# Patient Record
Sex: Male | Born: 1955
Health system: Southern US, Community
[De-identification: ages and names within clinical notes are randomized; demographics above are authoritative.]

## PROBLEM LIST (undated history)

## (undated) DIAGNOSIS — N529 Male erectile dysfunction, unspecified: Secondary | ICD-10-CM

## (undated) DIAGNOSIS — I219 Acute myocardial infarction, unspecified: Secondary | ICD-10-CM

## (undated) DIAGNOSIS — E782 Mixed hyperlipidemia: Secondary | ICD-10-CM

## (undated) DIAGNOSIS — D699 Hemorrhagic condition, unspecified: Secondary | ICD-10-CM

## (undated) DIAGNOSIS — I10 Essential (primary) hypertension: Secondary | ICD-10-CM

## (undated) DIAGNOSIS — R0600 Dyspnea, unspecified: Secondary | ICD-10-CM

## (undated) DIAGNOSIS — F419 Anxiety disorder, unspecified: Secondary | ICD-10-CM

## (undated) DIAGNOSIS — E7800 Pure hypercholesterolemia, unspecified: Secondary | ICD-10-CM

## (undated) DIAGNOSIS — J449 Chronic obstructive pulmonary disease, unspecified: Secondary | ICD-10-CM

## (undated) DIAGNOSIS — F32A Depression, unspecified: Secondary | ICD-10-CM

## (undated) DIAGNOSIS — F329 Major depressive disorder, single episode, unspecified: Secondary | ICD-10-CM

## (undated) DIAGNOSIS — K219 Gastro-esophageal reflux disease without esophagitis: Secondary | ICD-10-CM

## (undated) DIAGNOSIS — I2510 Atherosclerotic heart disease of native coronary artery without angina pectoris: Secondary | ICD-10-CM

## (undated) HISTORY — DX: Hemorrhagic condition, unspecified: D69.9

## (undated) HISTORY — DX: Pure hypercholesterolemia, unspecified: E78.00

## (undated) HISTORY — PX: ESOPHAGOGASTRODUODENOSCOPY: SHX1529

## (undated) HISTORY — PX: NO PAST SURGERIES: SHX2092

## (undated) HISTORY — DX: Atherosclerotic heart disease of native coronary artery without angina pectoris: I25.10

## (undated) HISTORY — DX: Male erectile dysfunction, unspecified: N52.9

## (undated) HISTORY — DX: Essential (primary) hypertension: I10

## (undated) HISTORY — DX: Mixed hyperlipidemia: E78.2

---
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
## 2002-03-23 DIAGNOSIS — I219 Acute myocardial infarction, unspecified: Secondary | ICD-10-CM

## 2002-03-23 HISTORY — PX: CARDIAC CATHETERIZATION: SHX172

## 2002-03-23 HISTORY — PX: OTHER SURGICAL HISTORY: SHX169

## 2002-03-23 HISTORY — DX: Acute myocardial infarction, unspecified: I21.9

## 2002-07-12 ENCOUNTER — Inpatient Hospital Stay (HOSPITAL_COMMUNITY): Admission: EM | Admit: 2002-07-12 | Discharge: 2002-07-14 | Payer: Self-pay | Admitting: *Deleted

## 2002-07-12 ENCOUNTER — Encounter: Payer: Self-pay | Admitting: Cardiology

## 2004-04-17 ENCOUNTER — Ambulatory Visit: Payer: Self-pay | Admitting: Cardiology

## 2005-05-20 ENCOUNTER — Ambulatory Visit: Payer: Self-pay | Admitting: Cardiology

## 2005-05-25 ENCOUNTER — Ambulatory Visit: Payer: Self-pay | Admitting: Cardiology

## 2006-06-01 ENCOUNTER — Ambulatory Visit: Payer: Self-pay | Admitting: Cardiology

## 2006-06-14 ENCOUNTER — Encounter: Payer: Self-pay | Admitting: Physician Assistant

## 2006-06-15 ENCOUNTER — Ambulatory Visit: Payer: Self-pay | Admitting: Cardiology

## 2007-04-22 ENCOUNTER — Encounter: Payer: Self-pay | Admitting: Cardiology

## 2007-06-06 ENCOUNTER — Encounter: Payer: Self-pay | Admitting: Cardiology

## 2007-08-02 ENCOUNTER — Ambulatory Visit: Payer: Self-pay | Admitting: Cardiology

## 2007-12-22 ENCOUNTER — Encounter: Payer: Self-pay | Admitting: Cardiovascular Disease

## 2007-12-22 ENCOUNTER — Encounter: Payer: Self-pay | Admitting: Cardiology

## 2008-08-29 ENCOUNTER — Ambulatory Visit: Payer: Self-pay | Admitting: Cardiology

## 2008-09-28 ENCOUNTER — Encounter (INDEPENDENT_AMBULATORY_CARE_PROVIDER_SITE_OTHER): Payer: Self-pay | Admitting: *Deleted

## 2008-12-10 DIAGNOSIS — E785 Hyperlipidemia, unspecified: Secondary | ICD-10-CM

## 2008-12-10 DIAGNOSIS — I2510 Atherosclerotic heart disease of native coronary artery without angina pectoris: Secondary | ICD-10-CM | POA: Insufficient documentation

## 2008-12-10 DIAGNOSIS — I10 Essential (primary) hypertension: Secondary | ICD-10-CM | POA: Insufficient documentation

## 2009-07-03 ENCOUNTER — Telehealth (INDEPENDENT_AMBULATORY_CARE_PROVIDER_SITE_OTHER): Payer: Self-pay | Admitting: *Deleted

## 2009-07-18 ENCOUNTER — Ambulatory Visit: Payer: Self-pay | Admitting: Cardiology

## 2009-07-18 ENCOUNTER — Encounter (INDEPENDENT_AMBULATORY_CARE_PROVIDER_SITE_OTHER): Payer: Self-pay | Admitting: *Deleted

## 2009-07-18 DIAGNOSIS — R072 Precordial pain: Secondary | ICD-10-CM

## 2009-07-22 ENCOUNTER — Encounter: Payer: Self-pay | Admitting: Cardiology

## 2009-07-22 ENCOUNTER — Encounter: Payer: Self-pay | Admitting: Cardiovascular Disease

## 2009-07-22 ENCOUNTER — Ambulatory Visit: Payer: Self-pay | Admitting: Cardiology

## 2009-08-07 ENCOUNTER — Encounter: Payer: Self-pay | Admitting: Cardiology

## 2009-08-14 ENCOUNTER — Encounter: Payer: Self-pay | Admitting: Cardiovascular Disease

## 2009-08-14 ENCOUNTER — Ambulatory Visit: Payer: Self-pay | Admitting: Cardiology

## 2009-08-22 ENCOUNTER — Encounter (INDEPENDENT_AMBULATORY_CARE_PROVIDER_SITE_OTHER): Payer: Self-pay | Admitting: *Deleted

## 2009-11-06 ENCOUNTER — Ambulatory Visit: Payer: Self-pay | Admitting: Cardiology

## 2009-11-06 DIAGNOSIS — F528 Other sexual dysfunction not due to a substance or known physiological condition: Secondary | ICD-10-CM | POA: Insufficient documentation

## 2010-04-22 NOTE — Progress Notes (Signed)
Summary: PATIENT CALLED REQUESTING EARLIER APPT FOR CP  Medications Added NITROSTAT 0.4 MG SUBL (NITROGLYCERIN) 1 tab under tongue for chest pain,repeat evry 5 min up to 3 doses if no relief. Proced to ED if chest pain continues       Phone Note Call from Patient Call back at Home Phone [phone]   Caller: Patient Call For: nurse Summary of Call: Patient c/o chest tightness that come and go lasting about 2-3 min. and wanted an earlier appointment than June. Patient has nitro but hasn't used it. Nurse informed patient to go to ED for evaluation. Patient stated that he would go if pain came again. Nurse offered new rx to be sent,uses [facility]   Initial call taken by: Carlye Grippe,  July 03, 2009 11:52 AM  Follow-up for Phone Call        okay to add earlier when opening Follow-up by: Lewayne Bunting, MD, [facility],  July 04, 2009 2:11 PM  Additional Follow-up for Phone Call Additional follow up Details #1::        patient's mother informed that appointment  given for April 28th @1 :00pm with Dr. Andee Lineman.  Additional Follow-up by: Carlye Grippe,  July 04, 2009 2:44 PM    New/Updated Medications: NITROSTAT 0.4 MG SUBL (NITROGLYCERIN) 1 tab under tongue for chest pain,repeat evry 5 min up to 3 doses if no relief. Proced to ED if chest pain continues Prescriptions: NITROSTAT 0.4 MG SUBL (NITROGLYCERIN) 1 tab under tongue for chest pain,repeat evry 5 min up to 3 doses if no relief. Proced to ED if chest pain continues  #25 x 0   Entered by:   Carlye Grippe   Authorized by:   Lewayne Bunting, MD, [facility]   Signed by:   Carlye Grippe on 07/03/2009   Method used:   Electronically to        [facility] (retail)       [address]       [place], [place]  [address]       Ph: [phone]       Fax: [phone]   RxID:   [id number]

## 2010-04-22 NOTE — Letter (Signed)
Summary: Engineer, materials at [facility]  [address]   [place], [place] [address]   Phone: [phone]  Fax: [phone]        August 22, 2009 MRN: [id number]   OJAS COONE [address] [place], [place]  [address]   Dear Mr. Artola,  Your test ordered by Selena Batten has been reviewed by your physician (or physician assistant) and was found to be normal or stable. Your physician (or physician assistant) felt no changes were needed at this time.  ____ Echocardiogram  __X__ Cardiac Stress Test  ____ Lab Work  ____ Peripheral vascular study of arms, legs or neck  ____ CT scan or X-ray  ____ Lung or Breathing test  ____ Other:   Thank you.   Hoover Brunette, LPN    Duane Boston, M.D., F.A.C.C. Thressa Sheller, M.D., F.A.C.C. Oneal Grout, M.D., F.A.C.C. Cheree Ditto, M.D., F.A.C.C. Daiva Nakayama, M.D., F.A.C.C. Kenney Houseman, M.D., F.A.C.C. Jeanne Ivan, PA-C

## 2010-04-22 NOTE — Cardiovascular Report (Signed)
Summary: Cardiac Catheterization  Cardiac Catheterization   Imported By: Dorise Hiss 07/17/2009 15:13:48  _____________________________________________________________________  External Attachment:    Type:   Image     Comment:   External Document

## 2010-04-22 NOTE — Assessment & Plan Note (Signed)
Summary: patient requested early appt for CP/LA   Visit Type:  chest pain/ follow-up Primary Provider:  Sherryll Burger  CC:  follow-up visit and been having chest pain.  History of Present Illness: the patient is a pleasant 55 year old male history of coronary artery disease, status post anterior wall myocardial infarction requiring Cypher stent x2 in 2004 the patient had a Cardiolite study done in 2009 which was within normal limits. He has known residual lesion of 80% in the distal circumflex coronary artery. He has preserved LV function.  The patient presents with symptoms of atypical chest pain. He states when he is watching TV mostly in the evening he still develops a tightness in the middle of the chest but only typically lasts a few seconds. He also states younger days he was at his friend's junkyard and when bending over and tried to lift up something he developed a similar chest pain. On the other hand exertion does not appear to be causing symptoms. The patient walks everyday to his mailbox is approximately 50 yards and reports no chest pain or shortness of breath. He reports no orthopnea PND.  The patient has stopped smoking since 2004. Cardiovascular standpoint he reports no other symptoms. He does state that when he looks up or sometimes has floaters in both eyes.  Preventive Screening-Counseling & Management  Alcohol-Tobacco     Smoking Status: quit     Year Quit: 2004  Current Medications (verified): 1)  Toprol Xl 25 Mg Xr24h-Tab (Metoprolol Succinate) .... Take 1 Tablet Daily 2)  Nitrostat 0.4 Mg Subl (Nitroglycerin) .Marland Kitchen.. 1 Tab Under Tongue For Chest Pain,repeat Evry 5 Min Up To 3 Doses If No Relief. Proced To Ed If Chest Pain Continues 3)  Cialis 10 Mg Tabs (Tadalafil) .... Take 1 Tablet By Mouth As Directed 4)  Metformin Hcl 500 Mg Tabs (Metformin Hcl) .... Take 2 Tablet By Mouth Two Times A Day 5)  Fish Oil 1000 Mg Caps (Omega-3 Fatty Acids) .... Take 4 Tablet By Mouth Once A  Day 6)  Aspir-Low 81 Mg Tbec (Aspirin) .... Take 1 Tablet By Mouth Once A Day 7)  Crestor 10 Mg Tabs (Rosuvastatin Calcium) .... Take 1 Tablet By Mouth Once A Day 8)  Omeprazole 20 Mg Cpdr (Omeprazole) .... Take 1 Tablet By Mouth Once A Day  Allergies (verified): No Known Drug Allergies  Comments:  Nurse/Medical Assistant: The patient's medications and allergies were reviewed with the patient and were updated in the Medication and Allergy Lists. Bottles reviewed.  Past History:  Past Medical History: Last updated: 12/10/2008 HYPERTENSION, UNSPECIFIED (ICD-401.9) HYPERLIPIDEMIA-MIXED (ICD-272.4) CAD, NATIVE VESSEL (ICD-414.01) Erectile dysfunction, on Cialis Diabetes mellitus  Family History: Last updated: 12/10/2008 heart disease  Social History: Last updated: 12/10/2008 Single  Tobacco Use - Former.  Alcohol Use - no  Risk Factors: Smoking Status: quit (07/18/2009)  Clinical Review Panels:  Cardiac Imaging Cardiac Cath Findings  CONCLUSIONS:  1. Anterior wall myocardial infarction treated with percutaneous     transluminal coronary angioplasty and stenting in both the proximal and     mid left anterior descending distribution.  2. Residual circumflex disease.  3. Hypokinesis of the anterolateral apical segment with probable high chance     for recovery given the electrocardiographic and ventriculographic     findings.   PLAN:  1. The patient will be treated with aspirin and Plavix.  2. Rehab.  3. Discontinuation of smoking.  4. I will review the circumflex with my colleagues as to whether this  should     be treated medically.                                             Arturo Morton. Riley Kill, M.D. (07/12/2002)    Review of Systems       The patient complains of chest pain.  The patient denies fatigue, malaise, fever, weight gain/loss, vision loss, decreased hearing, hoarseness, palpitations, shortness of breath, prolonged cough, wheezing, sleep apnea, coughing  up blood, abdominal pain, blood in stool, nausea, vomiting, diarrhea, heartburn, incontinence, blood in urine, muscle weakness, joint pain, leg swelling, rash, skin lesions, headache, fainting, dizziness, depression, anxiety, enlarged lymph nodes, easy bruising or bleeding, and environmental allergies.    Vital Signs:  Patient profile:   55 year old male Height:      70 inches Weight:      197 pounds BMI:     28.37 Pulse rate:   67 / minute BP sitting:   134 / 79  (left arm) Cuff size:   large  Vitals Entered By: Carlye Grippe (July 18, 2009 1:17 PM)  Nutrition Counseling: Patient's BMI is greater than 25 and therefore counseled on weight management options. CC: follow-up visit, been having chest pain   Physical Exam  Additional Exam:  General: Well-developed, well-nourished in no distress head: Normocephalic and atraumatic eyes PERRLA/EOMI intact, conjunctiva and lids normal nose: No deformity or lesions mouth normal dentition, normal posterior pharynx neck: Supple, no JVD.  No masses, thyromegaly or abnormal cervical nodes lungs: Normal breath sounds bilaterally without wheezing.  Normal percussion heart: regular rate and rhythm with normal S1 and S2, no S3 or S4.  PMI is normal.  No pathological murmurs abdomen: Normal bowel sounds, abdomen is soft and nontender without masses, organomegaly or hernias noted.  No hepatosplenomegaly musculoskeletal: Back normal, normal gait muscle strength and tone normal pulsus: Pulse is normal in all 4 extremities Extremities: No peripheral pitting edema neurologic: Alert and oriented x 3 skin: Intact without lesions or rashes cervical nodes: No significant adenopathy psychologic: Normal affect    EKG  Procedure date:  07/18/2009  Findings:      normal sinus rhythm. Normal EKG. Heart rate 66 beats per minute  Impression & Recommendations:  Problem # 1:  PRECORDIAL PAIN (ICD-786.51) the patient's chest pain is very atypical for  angina. We will proceed with an exercise treadmill test. The patient has requested that due to his limited financial means. I do think that this is a reasonable test given the low likelihood that this represents angina. We will hold his beta blocker for 24 hours prior to the test.in the meanwhile I increased the patient's metoprolol to 50 mg a day and added Imdur30 mg p.o. q. daily. I made very clear to the patient that he cannot use his Cialis at the same time. The following medications were removed from the medication list:    Accupril 10 Mg Tabs (Quinapril hcl) .Marland Kitchen... Take 1 tablet by mouth once a day His updated medication list for this problem includes:    Metoprolol Succinate 50 Mg Xr24h-tab (Metoprolol succinate) .Marland Kitchen... Take one tablet by mouth daily    Nitrostat 0.4 Mg Subl (Nitroglycerin) .Marland Kitchen... 1 tab under tongue for chest pain,repeat evry 5 min up to 3 doses if no relief. proced to ed if chest pain continues    Aspir-low 81 Mg Tbec (Aspirin) .Marland KitchenMarland KitchenMarland KitchenMarland Kitchen  Take 1 tablet by mouth once a day    Isosorbide Mononitrate Cr 30 Mg Xr24h-tab (Isosorbide mononitrate) .Marland Kitchen... Take one tablet by mouth daily  Orders: GXT (GXT)  Problem # 2:  HYPERTENSION, UNSPECIFIED (ICD-401.9) Assessment: Comment Only  The following medications were removed from the medication list:    Accupril 10 Mg Tabs (Quinapril hcl) .Marland Kitchen... Take 1 tablet by mouth once a day His updated medication list for this problem includes:    Metoprolol Succinate 50 Mg Xr24h-tab (Metoprolol succinate) .Marland Kitchen... Take one tablet by mouth daily    Aspir-low 81 Mg Tbec (Aspirin) .Marland Kitchen... Take 1 tablet by mouth once a day  Problem # 3:  HYPERLIPIDEMIA-MIXED (ICD-272.4) patient will continue on statin drug therapy The following medications were removed from the medication list:    Simvastatin 80 Mg Tabs (Simvastatin) .Marland Kitchen... Take 1 tablet by mouth once a day His updated medication list for this problem includes:    Crestor 10 Mg Tabs (Rosuvastatin calcium)  .Marland Kitchen... Take 1 tablet by mouth once a day  Other Orders: EKG w/ Interpretation (93000)  Patient Instructions: 1)  Your physician wants you to follow-up in: 3 months. You will receive a reminder letter in the mail one-two months in advance. If you don't receive a letter, please call our office to schedule the follow-up appointment. 2)  Your physician has requested that you have an exercise tolerance test.  For further information please visit [email or web address]  Please also follow instruction sheet, as given. 3)  Increase Toprol (Metoprolol) to 50mg  by mouth once daily. You may take 2 of your 25mg  tablets until gone and then get new prescription filled for 50mg  tablets. 4)  HOLD CIALIS. 5)  Start Imdur (isosorbide) 30mg  by mouth once daily. Prescriptions: ISOSORBIDE MONONITRATE CR 30 MG XR24H-TAB (ISOSORBIDE MONONITRATE) Take one tablet by mouth daily  #30 x 6   Entered by:   Cyril Loosen, RN, BSN   Authorized by:   Lewayne Bunting, MD, [facility]   Signed by:   Cyril Loosen, RN, BSN on 07/18/2009   Method used:   Electronically to        [facility] (retail)       [address]       [place], [place]  [address]       Ph: [phone]       Fax: [phone]   RxID:   [id number]   Handout requested. METOPROLOL SUCCINATE 50 MG XR24H-TAB (METOPROLOL SUCCINATE) Take one tablet by mouth daily  #30 x 6   Entered by:   Cyril Loosen, RN, BSN   Authorized by:   Lewayne Bunting, MD, [facility]   Signed by:   Cyril Loosen, RN, BSN on 07/18/2009   Method used:   Electronically to        [facility] (retail)       [address]       [place], [place]  [address]       Ph: [phone]       Fax: [phone]   RxID:   [id number]

## 2010-04-22 NOTE — Miscellaneous (Signed)
Summary: Orders Update - Lexiscan   Clinical Lists Changes  Orders: Added new Referral order of Nuclear Med (Nuc Med) - Signed 

## 2010-04-22 NOTE — Letter (Signed)
Summary: Graded Exercise Tolerance Test  [facility] [address]   [place], [place] [address]   Phone: [phone]  Fax: [phone]      [facility]  Graded Exercise Tolerance Test    Ronnie Maynard  Appointment Date:_  Appointment Time:_   Your doctor has ordered a stress test to help determine the condition of your  heart during exercise. If you take blood pressure medicine , ask your doctor if you should take it the day of your test. You may eat a light meal before your test.  Please be sure to bring the copy of your order with you.   You should dress comfortably, for example: Sweat pants, shorts, or skirt, Loose                                                                                                       short sleeved T-shirt, Rubber soled lace-up shoes (tennis shoes)  You will need to arrive 15 minutes before your appointment time. You will also need to enter at the Main Entrance of the hospital and go to the registration desk. They will direct you to the Cardiovascular Department on the third floor.  You will need to plan on being at the hospital for one hour from registration for this appointment.   DO NOT TAKE YOUR METOPROLOL (TOPROL) THE NIGHT BEFORE OR MORNING OF YOUR TEST. IT NEEDS TO BE HELD FOR 24 HOURS BEFORE TEST. YOU MAY TAKE OTHER MEDS WITH A SIP OF WATER.

## 2010-04-22 NOTE — Assessment & Plan Note (Signed)
Summary: 3 month fu recv reminder vs   Visit Type:  Follow-up Primary Ronnie Maynard:  Ronnie Maynard   History of Present Illness: the patient is a 55 year old male with a history coronary artery disease, status post prior anterior wall myocardial infarction requiring Cypher stent x 2 in 2004.  A recent Cardiolite study done in June of this year showed no ischemia and preserved LV function.  The patient has no residual lesion of the distal circumflex.  The patient has been doing well.  He reports no recurrent chest pain.  He denies any shortness of breath orthopnea PND.  He denies any palpitations or syncope.  He is requesting a prescription for Levitra today.  Slightly hypertensive in the office which is a one-time reading and reportedly prior his blood pressure has been normal  Preventive Screening-Counseling & Management  Alcohol-Tobacco     Smoking Status: quit     Year Quit: 2044  Current Medications (verified): 1)  Metoprolol Succinate 50 Mg Xr24h-Tab (Metoprolol Succinate) .... Take One Tablet By Mouth Daily 2)  Nitrostat 0.4 Mg Subl (Nitroglycerin) .Marland Kitchen.. 1 Tab Under Tongue For Chest Pain,repeat Evry 5 Min Up To 3 Doses If No Relief. Proced To Ed If Chest Pain Continues 3)  Metformin Hcl 500 Mg Tabs (Metformin Hcl) .... Take 2 Tablet By Mouth Two Times A Day 4)  Fish Oil 1000 Mg Caps (Omega-3 Fatty Acids) .... Take 4 Tablet By Mouth Once A Day 5)  Aspir-Low 81 Mg Tbec (Aspirin) .... Take 1 Tablet By Mouth Once A Day 6)  Crestor 10 Mg Tabs (Rosuvastatin Calcium) .... Take 1 Tablet By Mouth Once A Day 7)  Omeprazole 20 Mg Cpdr (Omeprazole) .... Take 1 Tablet By Mouth Once A Day 8)  Cialis 10 Mg Tabs (Tadalafil) .... Take One Tab 30 Minutes Prior To Sexual Acttivity  Allergies (verified): No Known Drug Allergies  Comments:  Nurse/Medical Assistant: The patient's medications and allergies were verbally reviewed with the patient and were updated in the Medication and Allergy Lists.  Past  History:  Past Medical History: HYPERTENSION, UNSPECIFIED (ICD-401.9) HYPERLIPIDEMIA-MIXED (ICD-272.4) CAD, NATIVE VESSEL (ICD-414.01) Erectile dysfunction, on Levitra Diabetes mellitus Ronnie Maynard by stress study in June of 2011 her LV function ejection fraction 55% normal ventricle volumes normal perfusion.  Review of Systems  The patient denies fatigue, malaise, fever, weight gain/loss, vision loss, decreased hearing, hoarseness, chest pain, palpitations, shortness of breath, prolonged cough, wheezing, sleep apnea, coughing up blood, abdominal pain, blood in stool, nausea, vomiting, diarrhea, heartburn, incontinence, blood in urine, muscle weakness, joint pain, leg swelling, rash, skin lesions, headache, fainting, dizziness, depression, anxiety, enlarged lymph nodes, easy bruising or bleeding, and environmental allergies.    Vital Signs:  Patient profile:   55 year old male Height:      70 inches Weight:      196 pounds Pulse rate:   69 / minute BP sitting:   131 / 87  (left arm) Cuff size:   regular  Vitals Entered By: Carlye Grippe (November 06, 2009 1:44 PM)  Physical Exam  Additional Exam:  General: Well-developed, well-nourished in no distress head: Normocephalic and atraumatic eyes PERRLA/EOMI intact, conjunctiva and lids normal nose: No deformity or lesions mouth normal dentition, normal posterior pharynx neck: Supple, no JVD.  No masses, thyromegaly or abnormal cervical nodes lungs: Normal breath sounds bilaterally without wheezing.  Normal percussion heart: regular rate and rhythm with normal S1 and S2, no S3 or S4.  PMI is normal.  No pathological murmurs abdomen:  Normal bowel sounds, abdomen is soft and nontender without masses, organomegaly or hernias noted.  No hepatosplenomegaly musculoskeletal: Back normal, normal gait muscle strength and tone normal pulsus: Pulse is normal in all 4 extremities Extremities: No peripheral pitting edema neurologic: Alert and oriented x  3 skin: Intact without lesions or rashes cervical nodes: No significant adenopathy psychologic: Normal affect    Impression & Recommendations:  Problem # 1:  CAD, NATIVE VESSEL (ICD-414.01) patient had a recent negative Cardui stress study.  He is asymptomatic.  Continue with medical therapy The following medications were removed from the medication list:    Isosorbide Mononitrate Cr 30 Mg Xr24h-tab (Isosorbide mononitrate) .Marland Kitchen... Take one tablet by mouth daily His updated medication list for this problem includes:    Metoprolol Succinate 50 Mg Xr24h-tab (Metoprolol succinate) .Marland Kitchen... Take one tablet by mouth daily    Nitrostat 0.4 Mg Subl (Nitroglycerin) .Marland Kitchen... 1 tab under tongue for chest pain,repeat evry 5 min up to 3 doses if no relief. proced to ed if chest pain continues    Aspir-low 81 Mg Tbec (Aspirin) .Marland Kitchen... Take 1 tablet by mouth once a day  Problem # 2:  HYPERLIPIDEMIA-MIXED (ICD-272.4) the patient is taking a statin and this is followed by the primary care physician. His updated medication list for this problem includes:    Crestor 10 Mg Tabs (Rosuvastatin calcium) .Marland Kitchen... Take 1 tablet by mouth once a day  Problem # 3:  ERECTILE DYSFUNCTION, NON-ORGANIC (ICD-302.72) I've given the patient prescription of Levitra.  His been advised not to use nitroglycerin around the time he takes Levitra.  His been carefully advised about this  Patient Instructions: 1)  Cialis 10mg  x 1 tab 30 minutes prior to sexual activity 2)  Follow up in  1 year Prescriptions: CIALIS 10 MG TABS (TADALAFIL) take one tab 30 minutes prior to sexual acttivity  #30 x 1   Entered by:   Hoover Brunette, LPN   Authorized by:   Lewayne Bunting, MD, [facility]   Signed by:   Hoover Brunette, LPN on 88/41/6606   Method used:   Electronically to        [facility] (retail)       [address]       [place], [place]  [address]       Ph: [phone]       Fax: [phone]   RxID:   [id number]

## 2010-08-05 NOTE — Assessment & Plan Note (Signed)
[facility]                          [facility]   NAME:Ronnie Maynard, Ronnie Maynard                         MRN:          [id number]  DATE:08/02/2007                            DOB:          01-28-1956    PRIMARY CARDIOLOGIST:  Jonelle Sidle, MD.   REASON FOR VISIT:  Annual followup.   Since last seen here in the clinic in March 2008, [name] reports no  interim development of signs/symptoms suggestive of unstable angina  pectoris.  He has not had to use any nitroglycerin.  He has not resumed  smoking tobacco, since his heart attack in 2004.   The patient underwent recent esophageal dilatation, by Dr. Karilyn Cota,  approximately 1 month ago.  He states that he has had this in the past.  He apparently was having significant dysphagia over the past several  months, and this has since resolved.   The patient was also briefly hospitalized here at [facility] in early January for chest pain, deemed atypical.  He is now  referred to [place] for formal consultation.  Review of hospital records  reveals completely normal cardiac markers.  Most recent lipid profile  from 1 month ago yielded total cholesterol 150, triglyceride 237, HDL 37  and LDL 66.  The patient was advised to start fish oil, which he has  done.   The patient is still plagued by erectile dysfunction, which has been  documented in our office chart in the past.  He had been prescribed  Levitra by Roxanne Mins, PA-C, back in 2007.  However, he informs me  today that he lost this prescription.  He states that he has never used  any of these agents.  Of note, he has not taken any long-acting nitrate,  which had been stopped in the past.  He also has not had to use any  sublingual nitroglycerin since his heart attack.   I did refer [name] for a surveillance exercise stress Cardiolite,  when I last saw him a year ago.  He was able to exercise for 9 minutes,  achieving  10.1 METS.  There was no associated chest pain or significant  EKG changes.  Perfusion imaging revealed a small, nonreversible inferior  defect suggestive of prior infarct, with no definite reversibility.   CURRENT MEDICATIONS:  1. Zocor 80 daily.  2. Metoprolol ER 50 daily.  3. Accupril 10 daily.  4. Full dose aspirin.  5. Metformin 500 daily.  6. Fish oil 1000 daily.   PHYSICAL EXAMINATION:  Blood pressure 120/70, pulse 65 and regular,  weight 207.8 (up 1).  GENERAL:  A 55 year old male, sitting upright, in no distress.  HEENT:  Normocephalic, atraumatic.  NECK:  Palpable bilateral carotid pulses without bruits; no JVD at 90  degrees.  LUNGS:  Clear to auscultation in all fields.  HEART:  Regular rate and rhythm (S1-S2) no murmurs, rubs or gallops.  ABDOMEN:  Protuberant, nontender.  EXTREMITIES:  No pedal edema.  NEUROLOGIC:  No focal deficit.   IMPRESSION:  1. Coronary artery disease.  a.     Status post acute anterior MI/emergent Cypher stenting LAD       (2), in April 2004.      b.     Residual 80% distal CFX, treated medically.      c.     Preserved left ventricular function.      d.     Low-risk adequate exercise stress Cardiolite; ejection       fraction 59%, March 2008.  2. Dyslipidemia.  3. History of tobacco.  4. Erectile dysfunction.  5. Hypertension.   PLAN:  1. Decrease aspirin back to 81 daily, which I had previously      recommended.  2. Followup FLP in 1 month, for reassessment of lipid status in light      of recent recommendation to add fish oil.  3. Down titrate Toprol back to 25 daily, which the patient had been on      when I last saw him a year ago.  This is particularly in light of      the fact that he has persistent erectile dysfunction.  I do      recommend, however, that the patient remain on a low dose of beta-      blocker, given his history of myocardial infarction.  The decreased      dose may mitigate his erectile dysfunction and  symptoms.  It these      persist, however, I did suggest that he consider seeing a      urologist.  In the meanwhile, he did request, and I did concede to,      using Cialis on a short-term basis.  We spoke at length about the      precautions with respect to using nitrates within a 24-hour time      frame.  He states that he is quite aware of this and had been      advised about this in the past, as well.  4. The patient was advised to modify his diet with respect to      saturated fat and carbohydrate intake.  He is also to try to walk      on a regular basis.  5. Recommend return clinic followup with Dr. Nona Dell in 1      year, or sooner as needed.     Gene Serpe, PA-C  Electronically Signed      Learta Codding, MD,FACC  Electronically Signed   GS/MedQ  DD: 08/02/2007  DT: 08/02/2007  Job #: [id number]   cc:   Kirstie Peri, MD

## 2010-08-05 NOTE — Assessment & Plan Note (Signed)
[facility]                          [facility] NOTE   NAME:Ronnie Maynard, Ronnie Maynard                         MRN:          [id number]  DATE:08/29/2008                            DOB:          Jan 09, 1956    HISTORY OF PRESENT ILLNESS:  The patient is a very pleasant 55 year old  male with a history of coronary artery disease, status post anterior  wall myocardial infarction requiring Cypher stent x2 in 2004.  Last  year, he had Lexiscan done in Dr. Margaretmary Eddy office and the study was normal  with an ejection fraction of 51% with no ischemia.  The patient has been  doing well.  He has good exercise tolerance.  He denies any chest pain,  short of breath, orthopnea, or PND.  He asked if it was safe to use  Cialis and I told him as far as he does not use the nitrates that this  would be okay.   MEDICATIONS:  1. Zocor 80 mg p.o. daily.  2. Accupril 10 mg p.o. daily.  3. Metformin 500 mg p.o. daily.  4. Fish oil 1000 mg p.o. daily.  5. Aspirin 81 mg p.o. daily.  6. Toprol 25 mg p.o. daily.  7. Zocor 80 mg p.o. daily.   PHYSICAL EXAMINATION:  VITAL SIGNS:  Blood pressure is 124/70, heart  rate is 65, and weight is 196.6.  GENERAL:  Well-nourished white male in no apparent distress.  HEENT:  Pupils, eyes are equal.  Conjunctivae clear.  NECK:  Supple.  Normal carotid upstroke.  No carotid bruits.  LUNGS:  Clear breath sounds bilaterally.  HEART:  Regular rate and rhythm with normal S1 and S2.  No murmur, rubs,  or gallops.  ABDOMEN:  Soft, nontender.  No rebound or guarding.  Good bowel sounds.  EXTREMITIES:  No cyanosis, clubbing, or edema.  NEUROLOGIC:  The patient is alert and oriented.  Grossly nonfocal.   PROBLEM LIST:  1. Coronary artery disease.      a.     Status post acute anterior myocardial infarction, emergent       Cypher stenting left anterior descending artery x2 in April 2004.      b.     Residual 80% distal circumflex, treated  medically.      c.     Preserved left ventricular function.      d.     Negative Lexiscan study by Dr. Sherryll Burger in 2009.  2. Dyslipidemia.  3. Tobacco use.  4. Erectile dysfunction, on Cialis.  5. Hypertension.  6. Diabetes mellitus.   PLAN:  1. The patient is doing extremely well.  He has no recurrent      substernal chest pain.  No further stress testing is needed at this      point in time as it was done by Dr. Sherryll Burger last year.  2. Lipid panel is also followed by Dr. Sherryll Burger and believe change in      medications up to him.  3. I did see in the prior records, the patient has had high  triglycerides and I told him to cut back on a carbohydrate      particularly in light of his diabetes.     Learta Codding, MD,FACC  Electronically Signed    GED/MedQ  DD: 08/29/2008  DT: 08/30/2008  Job #: [id number]   cc:   Kirstie Peri, MD

## 2010-08-08 NOTE — Discharge Summary (Signed)
NAME:  Ronnie Maynard, Ronnie Maynard NO.:  [id number]   MEDICAL RECORD NO.:  [id number]                   PATIENT TYPE:  INP   LOCATION:  2926                                 FACILITY:  [facility]   PHYSICIAN:  Jonelle Sidle, M.D. [facility]        DATE OF BIRTH:  April 26, 1955   DATE OF ADMISSION:  07/12/2002  DATE OF DISCHARGE:  07/14/2002                           DISCHARGE SUMMARY - REFERRING   PROCEDURES:  Emergent coronary angiogram/stenting on July 12, 2002.   REASON FOR ADMISSION:  Please refer to the dictated admission note.   LABORATORY DATA:  WBC 10.2, HGB 18, HCT 51.5, platelets 281 on admission.  Electrolytes and renal function normal.  Liver enzymes normal.  Normal  homocystine at 12.49.  Cardiac enzymes:  Normal total CPKs/peak MB 9.0; peak  troponin I 1.24.  Lipid profile:  Total cholesterol 196, triglycerides 208,  HDL 30, LDL 124 (ratio 6.5).  TSH normal.   Admission CXR:  Minimal thoracolumbar scoliosis, otherwise normal.   HOSPITAL COURSE:  Following presentation with new onset left-sided chest  discomfort with no known previous history of coronary artery disease, the  patient was found to have evidence of acute ST elevation anterior myocardial  infarction.  He was taken directly to the catheterization lab where he  underwent coronary angiography by Dr. Riley Kill (see report for full details).   The patient was found to have 95% proximal stenosis of the LAD which was  successfully stented (CYPHER) to 0% residual stenosis.  Additionally, there  was an 80% mid LAD lesion which was also stented.  Residual anatomy notable  for 80% mid circumflex and mild critical RCA.  LV function was preserved.   His postoperative was benign with no complaint of recurrent chest pain.   The patient was referred for smoking cessation consult and expressed desire  to discontinue on his own.   Low normal potassium was repeated prior to discharge.   The patient was  ambulating without complaint of chest pain and was cleared  for discharge on hospital day #2.   MEDICATIONS AT DISCHARGE:  1. Plavix 75 mg daily (x 6 months).  2. Coated aspirin 325 mg daily.  3. Lopressor 25 mg b.i.d.  4. Zocor 20 mg q.h.s.  5. Altace 2.5 mg daily.  6. Nitrostat 0.4 mg p.r.n.   ACTIVITY:  No heaving lifting, driving, or strenuous activity until seen by  physician.   DIET:  Maintain low-fat/cholesterol diet.   SPECIAL INSTRUCTIONS:  Stop smoking tobacco.   WOUND CARE:  Call the office if there is any swelling/bleeding in the groin.   FOLLOWUP:  The patient is scheduled to follow up with Jonelle Sidle,  M.D., in the [facility] on Friday, Jul 28, 2002, at 9:45 a.m.   DISCHARGE DIAGNOSES:  1. Acute anterior ST elevation myocardial infarction.     a. Emergent coronary artery stenting (CYPHER) of left anterior descending  x 3.     b. Residual 80% circumflex.     c. Preserved left ventricular function.  2. Tobacco.  3. Hypokalemia.     Gene Serpe, P.A. [facility]                      Jonelle Sidle, M.D. [facility]    GS/MEDQ  D:  09/27/2002  T:  09/27/2002  Job:  [id number]

## 2010-08-08 NOTE — H&P (Signed)
NAME:  Ronnie Maynard, Ronnie Maynard NO.:  [id number]   MEDICAL RECORD NO.:  [id number]                   PATIENT TYPE:  INP   LOCATION:  1823                                 FACILITY:  [facility]   PHYSICIAN:  Jonelle Sidle, M.D. [facility]        DATE OF BIRTH:  [birth year]   DATE OF ADMISSION:  07/12/2002  DATE OF DISCHARGE:                                HISTORY & PHYSICAL   The patient is followed by Charmayne Sheer, P.A. with [facility] in [place], [place].   CHIEF COMPLAINT:  Chest pain.   HISTORY OF PRESENT ILLNESS:  The patient is a 55 year old male with no major  past medical history by his report, who developed the onset of a cramping  chest discomfort at around 6:30 p.m.  yesterday while driving.  This  discomfort waxed and waned throughout the entire evening, and ultimately  this morning when he presented to work, he was encouraged to seek medical  attention.  He works in [employer] in [employer] and presented to the  [employer] [place] [facility] Emergency Department, at which time a 12-  lead electrocardiogram showed evidence of an anterior wall myocardial  infarction, with ST elevation in leads V1 through V4, as well as in the high  lateral leads I and aVL.  There were also some mild reciprocal changes in  leads II, III and aVF.  The patient continues to complain to 1-2/10 chest  discomfort.  He denies any prior exertional symptoms or a known history of  coronary artery disease/myocardial infarction.  We discussed an emergent  cardiac catheterization for a clear diagnosis and therapy, including its  risks and benefits.  He agrees to proceed.   ALLERGIES:  No known drug allergies.   CURRENT MEDICATIONS:  None.   PAST MEDICAL HISTORY:  1. No known history of type 2 diabetes mellitus or hypertension.  2. Possible history of dyslipidemia, on no specific medical therapy at this     time.  3. No known history of coronary artery  disease or myocardial infarction.   SOCIAL HISTORY:  The patient works in [employer] in [place].  He has a  40-year-history of tobacco use.  He denies alcohol or other substance abuse.   FAMILY HISTORY:  Significant for cardiovascular disease.  The patient's  father died at age 70, with a myocardial infarction and a stroke.  He also  has uncles with coronary artery disease, predominantly in their 82s to 34s.   REVIEW OF SYSTEMS:  As described in the history of present illness.  He has  had some diaphoresis.  He denied any significant nausea or dyspnea with his  presentation.  His chest pain did not radiate.   PHYSICAL EXAMINATION:  VITAL SIGNS:  The initial heart rate was in the 90s  and regular, showing a normal sinus rhythm, blood pressure initially  160/110.  GENERAL:  This is a  well-nourished male, lying supine, in no acute distress.  HEENT:  Conjunctivae and lids normal.  Pharynx clear.  NECK:  Supple without elevated jugular venous pressure or carotid bruits.  No thyromegaly is noted.  LUNGS:  Clear to auscultation without rales or rhonchi.  Respiratory effort  is not labored.  HEART:  Reveals a regular rate and rhythm without an S3 gallop or a  pericardial rub.  There is no significant murmur at this time.  ABDOMEN:  Soft, without hepatomegaly or bruits.  EXTREMITIES:  No edema.  Peripheral pulses are 1-2+.  SKIN:  No ulcerative changes are noted.  MUSCULOSKELETAL:  No kyphosis noted.  NEUROPSYCHIATRIC:  The patient is alert and oriented x3.  A chest x-ray is currently pending.  A 12-lead electrocardiogram is as outlined in the history of present  illness.  Initial i-STAT results show a creatinine of 0.8, BUN 12, potassium 4.1.  Hemoglobin 19.  The remainder of the laboratory data are pending.   IMPRESSION:  1. Evidence of an acute anterior wall myocardial infarction, based on the     electrocardiogram and symptoms.  The onset of pain was greater than 12     hours  ago.  2. Unknown lipid status, potential history of dyslipidemia.  3. Ongoing tobacco abuse.   PLAN:  1. Will transport the patient emergently to the cardiac catheterization     laboratory for a definitive angiography.  2. Check a fasting lipid profile.  3. Will begin therapy with aspirin and beta blocker.  The patient did     receive intravenous Lopressor in the emergency department initially.  He     is also on nitroglycerin.  4. Will need smoking cessation counseling.  5. Follow the blood pressure closely, as the patient was significantly     hypertension on presentation.  May need directive therapy.                                                 Jonelle Sidle, M.D. [facility]    SGM/MEDQ  D:  07/12/2002  T:  07/12/2002  Job:  [id number]

## 2010-08-08 NOTE — Discharge Summary (Signed)
NAME:  Ronnie Maynard, Ronnie Maynard                            ACCOUNT NO.:  [id number]   MEDICAL RECORD NO.:  [id number]                   PATIENT TYPE:  INP   LOCATION:  2926                                 FACILITY:  [facility]   PHYSICIAN:  Ronnie Maynard, M.D. [facility]        DATE OF BIRTH:  15-Aug-1955   DATE OF ADMISSION:  07/12/2002  DATE OF DISCHARGE:  07/14/2002                           DISCHARGE SUMMARY - REFERRING   PROCEDURES:  Emergent coronary angiogram, April 21.   REASON FOR ADMISSION:  The patient is a 55 year old male, with no prior  history of heart disease, who presented to the [facility] Emergency Room  with acute anterior myocardial infarction.  He presented to Dr. Simona Huh  who, following stabilization of the patient, proceeded with direct transfer  to the cardiac catheterization lab for emergent intervention.  Please refer  to dictated admission note for full details.   LABORATORY DATA:  Cardiac enzymes:  Normal total CPK with peak MB 9 (6-8)  and peak troponin I 1.24.  Lipid profile: Total cholesterol 196,  triglycerides 208, HDL 30, LDL 124 (ratio 6.5).  TSH normal.  Liver enzymes  normal.  Sodium 139, potassium 3.5, glucose 101, BUN 9, creatinine 1.9 at  discharge.  Normal CBC.   HOSPITAL COURSE:  Following presentation to the emergency room with acute  anterior myocardial infarction, the patient was taken directly to the  catheterization lab where he underwent emergent coronary angiography by Dr.  Shawnie Pons (See report for full details.), revealing 95% proximal LAD as  well as 80% mid LAD lesion.  These were both successfully dilated with a  Cypher stent, both to 0% residual stenosis with no noted complications.  Residual anatomy notable for an 80% mid circumflex and 30 to 40% proximal  RCA.  Left ventriculogram revealed anterolateral apical hypokinesis to  akinesis with EF approximately 56%.   Postoperative course was benign.  The patient ambulated in the CCU  with no  recurrent chest discomfort.   Dyslipidemia noted and patient placed on low-dose Zocor.   The patient had prior history of tobacco smoking and appeared very committed  to discontinuing smoking.   At time of discharge, mild hypokalemia was noted and repleted.  A chest x-  ray was also done on morning of discharge and will be reviewed at time of  office followup.   DISCHARGE MEDICATIONS:  1. Plavix 75 mg daily (6 months).  2. Aspirin 325 mg daily.  3. Toprol XL 50 mg daily.  4. Zocor 20 mg q.h.s.  5. Altace 2.5 mg daily.  6. Nitrostat 0.4 mg p.r.n.   DISCHARGE INSTRUCTIONS:  1. No heavy lifting, driving, or strenuous activity until seen by physician.  2. Maintain low-fat, low-cholesterol diet.  3. Call the office if there is any swelling or bleeding in the groin.  4. The patient is strongly advised to stop smoking tobacco.  5. The patient is scheduled to  follow up with Dr. Simona Huh on Friday,     May 7, at 9:45 a.m. at the [facility] in [place].   DISCHARGE DIAGNOSES:  1. Status post acute anterior ST-elevation myocardial infarction.     a. Emergent stent (Cypher to left anterior descending artery x 2).     b. Residual 80% circumflex.     c. Preserved left ventricular function.  2. Tobacco.  3. Dyslipidemia.  4. Hypokalemia.     Gene Serpe, P.A. [facility]                      Ronnie Maynard, M.D. [facility]    GS/MEDQ  D:  07/14/2002  T:  07/15/2002  Job:  [id number]   cc:   [facility], [place], [facility]  [address] [place], [place] [address]

## 2010-08-08 NOTE — Assessment & Plan Note (Signed)
[facility]                          [facility]   NAME:Ronnie Maynard, Ronnie Maynard                         MRN:          [id number]  DATE:06/01/2006                            DOB:          Jan 16, 1956    PRIMARY CARDIOLOGIST:  Dr. Simona Huh.   REASON FOR VISIT:  Annual followup.   Since last seen here in the clinic in February of 2007 by Roxanne Mins,  PA-C, the patient continues to do well clinically with no interim  development of signs or symptoms suggestive of unstable angina pectoris.  He recalls having had right-sided neck pain associated with his MI in  2004, and has not had any recurrent symptoms since undergoing successful  stenting of the left anterior descending artery.   Of note, the patient then had a followup exercise stress test in June of  that year, approximately 2 months later, for assessment of residual  ischemia in the circumflex territory.  Perfusion imaging, however, was  negative for evidence of ischemia with a calculated ejection fraction of  54%.  The patient also had no chest pain during the study.   The patient quit smoking tobacco following his heart attack.   The patient reports compliance with his medications.  He was taken off  Imdur when last seen in the clinic, given that it was noted at that time  that he expressed an interest in trying Levitra for treatment of  erectile dysfunction.  He tells me today, however, that he has not yet  filled his prescription, citing concerns about the use of it.   The patient also has not had any followup blood work since his last  clinic visit.   ELECTROCARDIOGRAM:  Today reveals NSR at 66 BPM with left axis deviation  and no ischemic changes.   CURRENT MEDICATIONS:  1. Full-dose aspirin.  2. Metoprolol ER 25 daily.  3. Accupril 5 daily.  4. Zocor 80 daily.   PHYSICAL EXAM:  Blood pressure 140/83, pulse 67, regular, weight 206.  GENERAL:  A 55 year old male sitting  upright in no distress.  HEENT:  Normocephalic, atraumatic.  NECK:  Palpable carotid pulses without bruits.  LUNGS:  Clear to auscultation in all fields.  HEART:  Regular rate and rhythm (S1, S2).  No murmurs, rubs, or gallops.  ABDOMEN:  Soft, nontender.  Intact bowel sounds.  EXTREMITIES:  Palpable pulses without edema.  NEURO:  No focal deficits.   IMPRESSION:  1. Coronary artery disease.      a.     Status post acute anterior myocardial infarction/emergent       CYPHER stenting left anterior descending (x2) April of 2004.      b.     Residual 80% distal circumflex artery.      c.     Preserved left ventricular function.      d.     Negative exercise stress Cardiolite in June of 2004.  2. Dyslipidemia.  3. History of tobacco.  4. Erectile dysfunction.   PLAN:  1. Schedule exercise stress Cardiolite given that it is now more than  3 years since his last stress test.  2. Decrease aspirin to 81 mg daily.  3. Schedule followup fasting lipid/liver profile, comprehensive      metabolic profile, and hemoglobin A1c.  4. Increase Accupril to 10 mg daily for better blood pressure control.  5. Schedule return clinic followup with Dr. Simona Huh in 1 year,      pending review of the stress test result.      Gene Serpe, PA-C  Electronically Signed      Learta Codding, MD,FACC  Electronically Signed   GS/MedQ  DD: 06/01/2006  DT: 06/03/2006  Job #: [id number]

## 2010-08-08 NOTE — Cardiovascular Report (Signed)
NAME:  Ronnie Maynard, Ronnie Maynard                            ACCOUNT NO.:  [id number]   MEDICAL RECORD NO.:  [id number]                   PATIENT TYPE:  INP   LOCATION:  2926                                 FACILITY:  [facility]   PHYSICIAN:  Arturo Morton. Riley Kill, M.D.             DATE OF BIRTH:  1955/09/26   DATE OF PROCEDURE:  07/12/2002  DATE OF DISCHARGE:                              CARDIAC CATHETERIZATION   INDICATIONS:  The patient is a pleasant 55 year old gentleman who presents  with an acute anterior wall infarction.  He was seen in the emergency room.  Onset of pain was earlier today.  An electrocardiogram was diagnostic and he  was brought to the laboratory for further evaluation.   PROCEDURE:  1. Left heart catheterization.  2. Selective coronary arteriography.  3. Selective left ventriculography.  4. Percutaneous transluminal coronary angioplasty and stenting of the left     anterior descending artery x2.   DESCRIPTION OF PROCEDURE:  The patient was brought to the catheterization  lab, prepped and draped in the usual fashion.  Through an anterior puncture,  the femoral artery was easily entered.  A 7-French sheath was placed.  Views  of the left and right coronary arteries were obtained in multiple  angiographic projections.  Ventriculography was performed in the RAO  projection.  Heparin was given according to protocol.  We then administered  Integrilin and documented an ACT in excess of 200 seconds.  A JL3.5 guiding  catheter was then placed.  The patient had a high-grade proximal left  anterior descending stenosis with TIMI-3 flow.  The lesion was crossed with  a 0.014 high-torque floppy wire.  The patient also had a middling of  intermediate severity.  Following this, the proximal acute lesion was  primarily stented using a 23 x3.0 Cordis CYPHER stent.  This Cordis CYPHER  stent was then post dilated using a 3.5 Quantum Maverick balloon throughout  the course of the stent.   Following this, I brought Dr. Allyson Sabal into the room,  and we reviewed the distal lesion.  It was his opinion that we should go  ahead and stent that as well as it was 80%.  It appeared to be potentially  flow limiting, although we were not completely sure of this.  The second  lesion was stented with a 2.5 x 23 Quantum Maverick balloon.  The stent was  about sized appropriately to the artery, so then we placed a 2.5 Quantum  Maverick dilatation balloon and post dilated this particular stent.  There  was marked improvement in the appearance of the artery.  The proximal 95%  stenosis was reduced to zero percent.  The mid stenosis of 80% was reduced  to zero percent.  TIMI-3 flow was documented in the distal vessel and runoff  was excellent.   After this, all catheters were removed and the femoral sheath was sewn into  place.  He was taken into the holding in satisfactory clinical condition.   HEMODYNAMIC DATA:  1. Central aorta 141/81, mean 106.  2. Left ventricle 118/11.  3. No aortic left ventricular gradient on pullback across the aortic valve.   LEFT VENTRICULOGRAPHY:  1. Ventriculography was performed in the RAO projection.  2. There was anterolateral apical hypokinesis.  3. Ejection fraction was 56%.    CORONARY ANGIOGRAPHY:  1. The left main coronary artery was free of critical disease.  2. The left anterior descending has a 95% stenosis after the first diagonal     and involving across the first septal perforator.  Following stenting     this was reduced to zero percent with the 3.0 x 23-mm stent, which was     post dilated to 3.5.  The distal stent was placed and reduced the     stenosis in the mid vessel from 80% to zero percent.  This overlapped the     origin of 2 diagonals.  The vessel was post dilated with a 2.5-mm high-     pressure balloon.  3. The circumflex provided a large first marginal which was free of     significant disease.  There was an AV circumflex distally  that had about     an 80% stenosis.  4. The right coronary artery demonstrates a mid 30% to 40% area of     narrowing, but the PDA and posterolateral system are free of critical     disease.   CONCLUSIONS:  1. Anterior wall myocardial infarction treated with percutaneous     transluminal coronary angioplasty and stenting in both the proximal and     mid left anterior descending distribution.  2. Residual circumflex disease.  3. Hypokinesis of the anterolateral apical segment with probable high chance     for recovery given the electrocardiographic and ventriculographic     findings.   PLAN:  1. The patient will be treated with aspirin and Plavix.  2. Rehab.  3. Discontinuation of smoking.  4. I will review the circumflex with my colleagues as to whether this should     be treated medically.                                               Arturo Morton. Riley Kill, M.D.    TDS/MEDQ  D:  07/12/2002  T:  07/13/2002  Job:  [id number]   cc:   CV Laboratory

## 2010-09-17 ENCOUNTER — Other Ambulatory Visit: Payer: Self-pay | Admitting: Cardiology

## 2010-12-05 ENCOUNTER — Encounter: Payer: Self-pay | Admitting: Cardiology

## 2010-12-09 ENCOUNTER — Ambulatory Visit (INDEPENDENT_AMBULATORY_CARE_PROVIDER_SITE_OTHER): Payer: Medicare Other | Admitting: Cardiology

## 2010-12-09 ENCOUNTER — Encounter: Payer: Self-pay | Admitting: Cardiology

## 2010-12-09 VITALS — BP 123/74 | HR 67 | Ht 67.0 in | Wt 188.0 lb

## 2010-12-09 DIAGNOSIS — G4700 Insomnia, unspecified: Secondary | ICD-10-CM

## 2010-12-09 DIAGNOSIS — N529 Male erectile dysfunction, unspecified: Secondary | ICD-10-CM | POA: Insufficient documentation

## 2010-12-09 DIAGNOSIS — E785 Hyperlipidemia, unspecified: Secondary | ICD-10-CM

## 2010-12-09 DIAGNOSIS — I2510 Atherosclerotic heart disease of native coronary artery without angina pectoris: Secondary | ICD-10-CM

## 2010-12-09 DIAGNOSIS — I10 Essential (primary) hypertension: Secondary | ICD-10-CM

## 2010-12-09 DIAGNOSIS — F528 Other sexual dysfunction not due to a substance or known physiological condition: Secondary | ICD-10-CM

## 2010-12-09 MED ORDER — TRAZODONE HCL 50 MG PO TABS: ORAL_TABLET | ORAL | Status: DC

## 2010-12-09 NOTE — Assessment & Plan Note (Signed)
Followup laboratory work the patient's primary care physician

## 2010-12-09 NOTE — Progress Notes (Signed)
HPI The patient is a 55 year old male with a history of prior anterior wall myocardial infarction in 2004. He underwent stent placement x2 to the LAD. He has some mild residual circumflex disease. His ejection fraction however was 56%. He has been doing well. He reports no chest pain orthopnea or PND. He reports no palpitations or syncope. He does report sexual dysfunction despite using Cialis. From a cardiovascular standpoint is stable  No Known Allergies  Current Outpatient Prescriptions on File Prior to Visit  Medication Sig Dispense Refill  . aspirin 81 MG tablet Take 81 mg by mouth daily.        . fish oil-omega-3 fatty acids 1000 MG capsule Take 4 capsules by mouth daily.        . metFORMIN (GLUCOPHAGE) 500 MG tablet Take 1,000 mg by mouth 2 (two) times daily with a meal.        . metoprolol (TOPROL-XL) 50 MG 24 hr tablet TAKE ONE TABLET BY MOUTH EVERY DAY  30 tablet  6  . nitroGLYCERIN (NITROSTAT) 0.4 MG SL tablet Place 0.4 mg under the tongue every 5 (five) minutes as needed. May repeat for up to 3 doses.       Marland Kitchen omeprazole (PRILOSEC) 20 MG capsule Take 20 mg by mouth daily.        . rosuvastatin (CRESTOR) 10 MG tablet Take 10 mg by mouth daily.        . tadalafil (CIALIS) 10 MG tablet Take 10 mg by mouth as needed.          Past Medical History  Diagnosis Date  . Hypertension, essential, benign   . Hyperlipidemia, mixed   . CAD (coronary artery disease), native coronary artery   . ED (erectile dysfunction)     On Levitra  . Diabetes mellitus     No past surgical history on file.  Family History  Problem Relation Age of Onset  . Heart disease Other     History   Social History  . Marital Status: Married    Spouse Name: N/A    Number of Children: N/A  . Years of Education: N/A   Occupational History  . Not on file.   Social History Main Topics  . Smoking status: Former Smoker -- 2.0 packs/day for 15 years    Types: Cigarettes    Quit date: 03/23/2002  .  Smokeless tobacco: Never Used  . Alcohol Use: No  . Drug Use: Not on file  . Sexually Active: Not on file   Other Topics Concern  . Not on file   Social History Narrative   Single   [id number] positives as outlined above. The remainder of the 18  point review of systems is negative  PHYSICAL EXAM BP 123/74  Pulse 67  Ht 5\' 7"  (1.702 m)  Wt 188 lb (85.276 kg)  BMI 29.44 kg/m2  SpO2 98%  General: Well-developed, well-nourished in no distress Head: Normocephalic and atraumatic Eyes:PERRLA/EOMI intact, conjunctiva and lids normal Ears: No deformity or lesions Mouth:normal dentition, normal posterior pharynx Neck: Supple, no JVD.  No masses, thyromegaly or abnormal cervical nodes Lungs: Normal breath sounds bilaterally without wheezing.  Normal percussion Cardiac: regular rate and rhythm with normal S1 and S2, no S3 or S4.  PMI is normal.  No pathological murmurs Abdomen: Normal bowel sounds, abdomen is soft and nontender without masses, organomegaly or hernias noted.  No hepatosplenomegaly MSK: Back normal, normal gait muscle strength and tone normal Vascular: Pulse is normal in all  4 extremities Extremities: No peripheral pitting edema Neurologic: Alert and oriented x 3 Skin: Intact without lesions or rashes Lymphatics: No significant adenopathy Psychologic: Normal affect   ECG: Normal sinus rhythm. Nonspecific ST-T wave changes  ASSESSMENT AND PLAN

## 2010-12-09 NOTE — Assessment & Plan Note (Signed)
Blood pressure well controlled. Continue current medical regimen. 

## 2010-12-09 NOTE — Patient Instructions (Signed)
   Trazodone - as needed for sleep  Your physician wants you to follow up in:  1 year.  You will receive a reminder letter in the mail one-two months in advance.  If you don't receive a letter, please call our office to schedule the follow up appointment

## 2010-12-09 NOTE — Assessment & Plan Note (Signed)
The patient was given a prescription for low-dose trazodone 25 mg by mouth each bedtime. The patient can increase the dose to 50 mg by mouth each bedtime if needed.

## 2010-12-09 NOTE — Assessment & Plan Note (Signed)
The patient will be referred to urology.

## 2010-12-09 NOTE — Assessment & Plan Note (Addendum)
Status post anterior wall myocardial infarction with stent placement to the LAD x2 in 2004. The patient reports no recurrent chest pain. We did obtain a bedside echocardiogram and his ejection fraction is within normal limits. He also stress test less than 2 years ago.

## 2010-12-19 ENCOUNTER — Other Ambulatory Visit: Payer: Self-pay | Admitting: *Deleted

## 2010-12-19 DIAGNOSIS — R37 Sexual dysfunction, unspecified: Secondary | ICD-10-CM

## 2011-04-24 ENCOUNTER — Other Ambulatory Visit: Payer: Self-pay | Admitting: Cardiology

## 2011-05-25 ENCOUNTER — Other Ambulatory Visit: Payer: Self-pay | Admitting: Cardiology

## 2011-12-10 ENCOUNTER — Ambulatory Visit: Payer: Medicare Other | Admitting: Cardiology

## 2011-12-17 ENCOUNTER — Ambulatory Visit: Payer: Medicare Other | Admitting: Cardiology

## 2012-02-04 ENCOUNTER — Ambulatory Visit: Payer: Medicare Other | Admitting: Cardiology

## 2012-03-30 ENCOUNTER — Ambulatory Visit: Payer: Medicare Other | Admitting: Cardiology

## 2012-05-20 ENCOUNTER — Ambulatory Visit: Payer: Medicare Other | Admitting: Cardiology

## 2012-06-24 ENCOUNTER — Ambulatory Visit: Payer: Medicare Other | Admitting: Cardiology

## 2012-11-28 ENCOUNTER — Ambulatory Visit: Payer: Medicare Other | Admitting: Cardiology

## 2013-01-03 ENCOUNTER — Ambulatory Visit: Payer: Medicare Other | Admitting: Cardiology

## 2013-02-14 ENCOUNTER — Ambulatory Visit: Payer: Medicare Other | Admitting: Cardiology

## 2013-03-02 ENCOUNTER — Ambulatory Visit (INDEPENDENT_AMBULATORY_CARE_PROVIDER_SITE_OTHER): Payer: Medicare Other | Admitting: Cardiology

## 2013-03-02 ENCOUNTER — Encounter: Payer: Self-pay | Admitting: Cardiology

## 2013-03-02 VITALS — BP 127/76 | HR 99 | Ht 67.0 in | Wt 187.0 lb

## 2013-03-02 DIAGNOSIS — I2510 Atherosclerotic heart disease of native coronary artery without angina pectoris: Secondary | ICD-10-CM

## 2013-03-02 DIAGNOSIS — I10 Essential (primary) hypertension: Secondary | ICD-10-CM

## 2013-03-02 DIAGNOSIS — E785 Hyperlipidemia, unspecified: Secondary | ICD-10-CM

## 2013-03-02 NOTE — Progress Notes (Signed)
Clinical Summary Ronnie Maynard is a 57 y.o.male former patient of Dr Ronnie Maynard, this is our first visit together. He was seen for the following problems.    1. CAD - prior anterior MI in 2004, stents x 2 to the LAD. LVEF 56%. Dr Ronnie Maynard bedside echo 11/2010 also indicated normal systolic function.  07/2009 Stress MPI no ischemia - reports approx once a week to once a month, feeling of mild sharp pain in mid chest 1/10 lasting just a minute. Can occur at rest or with exertion. Notes decreasing in frequency over the last several weeks. - compliant with meds: ASA, Toprol XL, crestor 20 - no tobacco use  2. HTN - doesn't check at home - compliant with meds  3. Hyperlipidemia - compliant with crestor - no recent panel in our system   Past Medical History  Diagnosis Date  . Hypertension, essential, benign   . Hyperlipidemia, mixed   . CAD (coronary artery disease), native coronary artery   . ED (erectile dysfunction)     On Levitra  . Diabetes mellitus      No Known Allergies   Current Outpatient Prescriptions  Medication Sig Dispense Refill  . amitriptyline (ELAVIL) 150 MG tablet Take 150 mg by mouth at bedtime.       Marland Kitchen aspirin 81 MG tablet Take 81 mg by mouth daily.        . metFORMIN (GLUCOPHAGE) 500 MG tablet Take 1,000 mg by mouth 2 (two) times daily with a meal.        . metoprolol (TOPROL-XL) 50 MG 24 hr tablet TAKE ONE TABLET BY MOUTH EVERY DAY  30 tablet  6  . NEXIUM 40 MG capsule Take 40 mg by mouth daily at 12 noon.       . nitroGLYCERIN (NITROSTAT) 0.4 MG SL tablet Place 0.4 mg under the tongue every 5 (five) minutes as needed. May repeat for up to 3 doses.       . rosuvastatin (CRESTOR) 20 MG tablet Take 20 mg by mouth daily.      . sitaGLIPtin (JANUVIA) 100 MG tablet Take 100 mg by mouth daily.      . diclofenac (VOLTAREN) 75 MG EC tablet       . tadalafil (CIALIS) 10 MG tablet Take 10 mg by mouth as needed.         No current facility-administered medications  for this visit.     No past surgical history on file.   No Known Allergies    Family History  Problem Relation Age of Onset  . Heart disease Other      Social History Ronnie Maynard reports that he quit smoking about 10 years ago. His smoking use included Cigarettes. He has a 30 pack-year smoking history. He has never used smokeless tobacco. Ronnie Maynard reports that he does not drink alcohol.   Review of Systems CONSTITUTIONAL: No weight loss, fever, chills, weakness or fatigue.  HEENT: Eyes: No visual loss, blurred vision, double vision or yellow sclerae.No hearing loss, sneezing, congestion, runny nose or sore throat.  SKIN: No rash or itching.  CARDIOVASCULAR: per HPI RESPIRATORY: No shortness of breath, cough or sputum.  GASTROINTESTINAL: No anorexia, nausea, vomiting or diarrhea. No abdominal pain or blood.  GENITOURINARY: No burning on urination, no polyuria NEUROLOGICAL: No headache, dizziness, syncope, paralysis, ataxia, numbness or tingling in the extremities. No change in bowel or bladder control.  MUSCULOSKELETAL: No muscle, back pain, joint pain or stiffness.  LYMPHATICS: No  enlarged nodes. No history of splenectomy.  PSYCHIATRIC: No history of depression or anxiety.  ENDOCRINOLOGIC: No reports of sweating, cold or heat intolerance. No polyuria or polydipsia.  Marland Kitchen   Physical Examination There were no vitals filed for this visit. Filed Weights   03/02/13 1245  Weight: 187 lb (84.823 kg)    Gen: resting comfortably, no acute distress HEENT: no scleral icterus, pupils equal round and reactive, no palptable cervical adenopathy,  CV: RRR, no m/r/g, no JVD, no carotid bruits Resp: Clear to auscultation bilaterally GI: abdomen is soft, non-tender, non-distended, normal bowel sounds, no hepatosplenomegaly MSK: extremities are warm, no edema.  Skin: warm, no rash Neuro:  no focal deficits Psych: appropriate affect    03/02/13 Clinic EKG: sinus rhythm, LAE, no  ischemic changes Assessment and Plan   1. CAD - atypical mild chest pain, continue to follow clinically - continue secondary prevention and risk factor modification  2. HTN - at goal, continue curren meds - have ordered a CMET, pending renal function and K likely start ACE-I at next visit in setting of DM  3. Hyperlpidemia - repeat lipid panel, continue current statin for now.     F/u 3 months    Ronnie Maynard, M.D., F.A.C.C.

## 2013-03-02 NOTE — Patient Instructions (Signed)
Your physician recommends that you schedule a follow-up appointment in: 3 months with Dr. Wyline Mood. This appointment will be scheduled today before you leave.  Your physician recommends that you return for lab work in: tomorrow for Fasting Lipid and CMET. Nothing to eat or drink after midnight.  You can go to the following locations to get lab work done: [facility] [address] Dr. Lysbeth Galas office in [place] Or [facility].   Your physician recommends that you continue on your current medications as directed. Please refer to the Current Medication list given to you today.

## 2013-03-13 ENCOUNTER — Telehealth: Payer: Self-pay | Admitting: Cardiology

## 2013-03-13 NOTE — Telephone Encounter (Signed)
WOULD LIKE A PRESCRIPTION FOR Ronnie Maynard

## 2013-03-13 NOTE — Telephone Encounter (Signed)
Pt informed that we no longer prescribe this medication. Pt stated that Dr. Earnestine Leys prescribed this medication. I informed pt that I was aware that Dr. Earnestine Leys prescribed this medication but our new doctors are not prescribing medications. Only cardiac medications. PT verbalized understanding.

## 2013-03-14 NOTE — Telephone Encounter (Signed)
Thank you for discussing with patient. I will not be handling his xanax, he will need to get refills from his PCP.  Dina Rich MD

## 2013-03-17 ENCOUNTER — Telehealth: Payer: Self-pay | Admitting: Cardiology

## 2013-03-17 NOTE — Telephone Encounter (Signed)
Informed pt of results. Pt verbalized understanding. 

## 2013-03-17 NOTE — Telephone Encounter (Signed)
Message copied by Burnice Logan on Fri Mar 17, 2013 12:13 PM ------      Message from: Dina Rich F      Created: Fri Mar 17, 2013 11:12 AM       Please let patient know labs are normal            Dina Rich MD ------

## 2013-05-25 ENCOUNTER — Encounter: Payer: Medicare Other | Admitting: Cardiology

## 2013-05-25 NOTE — Progress Notes (Signed)
ERROR

## 2013-07-03 ENCOUNTER — Other Ambulatory Visit: Payer: Self-pay | Admitting: Cardiology

## 2013-07-25 ENCOUNTER — Ambulatory Visit: Payer: Medicare Other | Admitting: Cardiology

## 2014-02-01 ENCOUNTER — Encounter: Payer: Self-pay | Admitting: Cardiology

## 2014-02-01 ENCOUNTER — Encounter: Payer: Medicare Other | Admitting: Cardiology

## 2014-02-01 NOTE — Progress Notes (Signed)
ERROR

## 2014-03-07 ENCOUNTER — Encounter: Payer: Self-pay | Admitting: Cardiology

## 2014-03-07 ENCOUNTER — Ambulatory Visit (INDEPENDENT_AMBULATORY_CARE_PROVIDER_SITE_OTHER): Payer: Medicare Other | Admitting: Cardiology

## 2014-03-07 ENCOUNTER — Encounter: Payer: Self-pay | Admitting: *Deleted

## 2014-03-07 VITALS — BP 125/79 | HR 97 | Ht 67.0 in | Wt 180.0 lb

## 2014-03-07 DIAGNOSIS — Z136 Encounter for screening for cardiovascular disorders: Secondary | ICD-10-CM

## 2014-03-07 DIAGNOSIS — I2510 Atherosclerotic heart disease of native coronary artery without angina pectoris: Secondary | ICD-10-CM

## 2014-03-07 DIAGNOSIS — R079 Chest pain, unspecified: Secondary | ICD-10-CM

## 2014-03-07 MED ORDER — NITROGLYCERIN 0.4 MG SL SUBL: 0.4000 mg | SUBLINGUAL_TABLET | SUBLINGUAL | Status: AC | PRN

## 2014-03-07 NOTE — Progress Notes (Signed)
Clinical Summary Ronnie Maynard is a 58 y.o.male seen today for follow up of the following medical problems.   1. CAD  - prior anterior MI in 2004, stents x 2 to the LAD. LVEF 55%. Dr Andee LinemaneGent bedside echo 11/2010 also indicated normal systolic function.  07/2009 Stress MPI no ischemia  - has had some recent episodes of chest pain - episode last night while vaccuming, left sided chest pain. Sharp pain, 2-3/10. No other associated symptoms. Not positional. Pain resolved after resting 2-3 minutes. [facility]   2. HTN  - doesn't check at home  - compliant with meds   3. Hyperlipidemia  - compliant with crestor  - 02/2013 lipid panel: TC 117 TG 221 HDL 36 LDL 37    Past Medical History  Diagnosis Date  . Hypertension, essential, benign   . Hyperlipidemia, mixed   . CAD (coronary artery disease), native coronary artery   . ED (erectile dysfunction)     On Levitra  . Diabetes mellitus      No Known Allergies   Current Outpatient Prescriptions  Medication Sig Dispense Refill  . amitriptyline (ELAVIL) 150 MG tablet Take 150 mg by mouth at bedtime.     Marland Kitchen. aspirin 81 MG tablet Take 81 mg by mouth daily.      . diclofenac (VOLTAREN) 75 MG EC tablet Take 75 mg by mouth 2 (two) times daily as needed.     . metFORMIN (GLUCOPHAGE) 500 MG tablet Take 1,000 mg by mouth 2 (two) times daily with a meal.      . metoprolol (TOPROL-XL) 50 MG 24 hr tablet TAKE ONE TABLET BY MOUTH EVERY DAY 30 tablet 6  . NEXIUM 40 MG capsule Take 40 mg by mouth daily at 12 noon.     . nitroGLYCERIN (NITROSTAT) 0.4 MG SL tablet Place 0.4 mg under the tongue every 5 (five) minutes as needed. May repeat for up to 3 doses.     . rosuvastatin (CRESTOR) 20 MG tablet Take 20 mg by mouth daily.    . sitaGLIPtin (JANUVIA) 100 MG tablet Take 100 mg by mouth daily.    . tadalafil (CIALIS) 10 MG tablet Take 10 mg by mouth as needed.       No current facility-administered  medications for this visit.     No past surgical history on file.   No Known Allergies    Family History  Problem Relation Age of Onset  . Heart disease Other      Social History Ronnie Maynard reports that he quit smoking about 11 years ago. His smoking use included Cigarettes. He has a 30 pack-year smoking history. He has never used smokeless tobacco. Ronnie Maynard reports that he does not drink alcohol.   Review of Systems CONSTITUTIONAL: No weight loss, fever, chills, weakness or fatigue.  HEENT: Eyes: No visual loss, blurred vision, double vision or yellow sclerae.No hearing loss, sneezing, congestion, runny nose or sore throat.  SKIN: No rash or itching.  CARDIOVASCULAR: per HPI RESPIRATORY: No shortness of breath, cough or sputum.  GASTROINTESTINAL: No anorexia, nausea, vomiting or diarrhea. No abdominal pain or blood.  GENITOURINARY: No burning on urination, no polyuria NEUROLOGICAL: No headache, dizziness, syncope, paralysis, ataxia, numbness or tingling in the extremities. No change in bowel or bladder control.  MUSCULOSKELETAL: No muscle, back pain, joint pain or stiffness.  LYMPHATICS: No enlarged nodes. No history of splenectomy.  PSYCHIATRIC: No history of depression or  anxiety.  ENDOCRINOLOGIC: No reports of sweating, cold or heat intolerance. No polyuria or polydipsia.  Marland Kitchen.   Physical Examination p 97 bp 125/79 Wt 180 lbs BMI 28 Gen: resting comfortably, no acute distress HEENT: no scleral icterus, pupils equal round and reactive, no palptable cervical adenopathy,  CV Resp: Clear to auscultation bilaterally GI: abdomen is soft, non-tender, non-distended, normal bowel sounds, no hepatosplenomegaly MSK: extremities are warm, no edema.  Skin: warm, no rash Neuro:  no focal deficits Psych: appropriate affect   Assessment and Plan  1. CAD  - recent episodes of chest pain, unclear if related to ischemia. Will obtain exercise cardiolite to better evaluate and  risk stratify - continue current meds  2. HTN  - at goal, continue curren meds   3. Hyperlpidemia  - continue current statin, request recent panel from pcp    F/ 3-4 weeks   Antoine PocheJonathan F. Oswin Griffith, M.D.

## 2014-03-07 NOTE — Patient Instructions (Signed)
Your physician has requested that you have en exercise stress myoview. For further information please visit [email or web address]. Please follow instruction sheet, as given. Office will contact with results via phone or letter.    Nitroglycerin refill sent to pharm Continue all other medications.   Follow up in  3-4 weeks

## 2014-03-19 ENCOUNTER — Ambulatory Visit (HOSPITAL_COMMUNITY): Admission: RE | Admit: 2014-03-19 | Payer: Medicare Other | Source: Ambulatory Visit

## 2014-03-19 ENCOUNTER — Encounter (HOSPITAL_COMMUNITY): Payer: Medicare Other

## 2014-03-27 ENCOUNTER — Encounter (HOSPITAL_COMMUNITY)
Admission: RE | Admit: 2014-03-27 | Discharge: 2014-03-27 | Disposition: A | Discharge status: Home / Self Care | Payer: Medicare Other | Source: Ambulatory Visit | Attending: Cardiology | Admitting: Cardiology

## 2014-03-27 ENCOUNTER — Ambulatory Visit (HOSPITAL_COMMUNITY)
Admission: RE | Admit: 2014-03-27 | Discharge: 2014-03-27 | Disposition: A | Discharge status: Home / Self Care | Payer: Medicare Other | Source: Ambulatory Visit | Attending: Cardiology | Admitting: Cardiology

## 2014-03-27 ENCOUNTER — Encounter (HOSPITAL_COMMUNITY): Payer: Self-pay

## 2014-03-27 DIAGNOSIS — R079 Chest pain, unspecified: Secondary | ICD-10-CM | POA: Insufficient documentation

## 2014-03-27 DIAGNOSIS — R0602 Shortness of breath: Secondary | ICD-10-CM | POA: Diagnosis not present

## 2014-03-27 MED ORDER — REGADENOSON 0.4 MG/5ML IV SOLN
0.4000 mg | Freq: Once | INTRAVENOUS | Status: AC
  Administered 2014-03-27: 0.4 mg via INTRAVENOUS

## 2014-03-27 MED ORDER — TECHNETIUM TC 99M SESTAMIBI GENERIC - CARDIOLITE
30.0000 | Freq: Once | INTRAVENOUS | Status: AC | PRN
  Administered 2014-03-27: 30 via INTRAVENOUS

## 2014-03-27 MED ORDER — TECHNETIUM TC 99M SESTAMIBI - CARDIOLITE
10.0000 | Freq: Once | INTRAVENOUS | Status: AC | PRN
  Administered 2014-03-27: 09:00:00 10 via INTRAVENOUS

## 2014-03-27 MED ORDER — SODIUM CHLORIDE 0.9 % IJ SOLN
10.0000 mL | INTRAMUSCULAR | Status: DC | PRN
  Administered 2014-03-27: 10 mL via INTRAVENOUS
  Filled 2014-03-27: qty 10

## 2014-03-27 MED ORDER — REGADENOSON 0.4 MG/5ML IV SOLN
INTRAVENOUS | Status: AC
  Filled 2014-03-27: qty 5

## 2014-03-27 MED ORDER — SODIUM CHLORIDE 0.9 % IJ SOLN
INTRAMUSCULAR | Status: AC
  Filled 2014-03-27: qty 10

## 2014-03-27 NOTE — Progress Notes (Signed)
Stress Lab Nurses Notes - Ronnie Maynard  Lynelle SmokeJim E Florentino 03/27/2014 Reason for doing test: Chest Pain and F/U Type of test: Lexiscan Myoview (initial order stress Cardiolite but pt stated last time they did TM they had to give him Eugenie BirksLexiscan) Nurse performing test: Enid Derryhristy Seraya Jobst RN Nuclear Medicine Tech: Lyndel Pleasureyan Liles Echo Tech: None MD performing test: Caffie DammeBranch/K Lawrence NP Family MD: Sherryll BurgerShah Test explained and consent signed: Yes.   IV started: Saline lock started in radiology Symptoms: "little" sob Treatment/Intervention: None Reason test stopped: protocol completed After recovery IV was: Discontinued via X-ray tech and No redness or edema Patient to return to Nuc. Med at :1030am Patient discharged: To another Medical Facility to [place] Patient's Condition upon discharge was: stable Comments: symptoms resolved during recovery. Peak BP 120/64 HR 92, Recovery BP 125/67, HR 89 [name]

## 2014-04-02 ENCOUNTER — Telehealth: Payer: Self-pay | Admitting: *Deleted

## 2014-04-02 NOTE — Telephone Encounter (Signed)
Pt made aware, confirmed appt for 1/13 with Dr. Wyline MoodBranch, forwarded to Dr. Sherryll BurgerShah

## 2014-04-02 NOTE — Telephone Encounter (Signed)
-----   Message from Antoine PocheJonathan F Branch, MD sent at 03/29/2014 10:03 AM EST ----- Stress test overall looks good, no evidence of any significant blockages. Will discuss further at our follow up in a few weeks  Dominga FerryJ Branch MD

## 2014-04-04 ENCOUNTER — Encounter: Payer: Medicare Other | Admitting: Cardiology

## 2014-04-04 NOTE — Progress Notes (Signed)
ERROR

## 2014-04-05 ENCOUNTER — Encounter: Payer: Self-pay | Admitting: Cardiology

## 2014-04-24 ENCOUNTER — Ambulatory Visit: Payer: Medicare Other | Admitting: Cardiology

## 2014-04-24 ENCOUNTER — Encounter: Payer: Self-pay | Admitting: Cardiology

## 2014-04-24 ENCOUNTER — Ambulatory Visit (INDEPENDENT_AMBULATORY_CARE_PROVIDER_SITE_OTHER): Payer: Medicare Other | Admitting: Cardiology

## 2014-04-24 VITALS — BP 130/81 | HR 93 | Ht 67.0 in | Wt 181.0 lb

## 2014-04-24 DIAGNOSIS — R079 Chest pain, unspecified: Secondary | ICD-10-CM

## 2014-04-24 DIAGNOSIS — I2510 Atherosclerotic heart disease of native coronary artery without angina pectoris: Secondary | ICD-10-CM

## 2014-04-24 MED ORDER — ISOSORBIDE MONONITRATE ER 30 MG PO TB24: 30.0000 mg | ORAL_TABLET | Freq: Every day | ORAL | Status: DC

## 2014-04-24 NOTE — Patient Instructions (Signed)
Your physician recommends that you schedule a follow-up appointment in: 2 MONTHS WITH DR. BRANCH  Your physician has recommended you make the following change in your medication:   START IMDUR 30 MG DAILY  CONTINUE ALL OTHER MEDICATIONS AS DIRECTED   Thank you for choosing [facility]!!

## 2014-04-24 NOTE — Progress Notes (Signed)
Clinical Summary Mr. Heppler is a 59 y.o.male seen today for follow up of the following medical problems. This is a focused visit on his history of CAD and chest pain.   1. CAD  - prior anterior MI in 2004, stents x 2 to the LAD. LVEF 55%. Dr Andee Lineman bedside echo 11/2010 also indicated normal systolic function.  - Last visit described some episodes of chest pain. Since last visit completed Lexiscan MPI which showed small mild inferolateral wall defect most likely secondary tosub- diaphragmatic attenuation, however cannot rule out mild ischemia. Overall low risk study.  - had wanted to start on imdur after last visit for additional antianginal but appears patient never started - reports one episode of cehst pain since last visit.    L 37   Past Medical History  Diagnosis Date  . Hypertension, essential, benign   . Hyperlipidemia, mixed   . CAD (coronary artery disease), native coronary artery   . ED (erectile dysfunction)     On Levitra  . Diabetes mellitus      No Known Allergies   Current Outpatient Prescriptions  Medication Sig Dispense Refill  . amitriptyline (ELAVIL) 150 MG tablet Take 150 mg by mouth at bedtime.     Marland Kitchen amitriptyline (ELAVIL) 150 MG tablet Take 150 mg by mouth at bedtime.    Marland Kitchen aspirin 81 MG tablet Take 81 mg by mouth daily.      . canagliflozin (INVOKANA) 300 MG TABS tablet Take 300 mg by mouth daily before breakfast.    . lisinopril (PRINIVIL,ZESTRIL) 2.5 MG tablet Take 2.5 mg by mouth daily.    . metFORMIN (GLUCOPHAGE) 500 MG tablet Take 1,000 mg by mouth 2 (two) times daily with a meal.      . metoprolol (TOPROL-XL) 50 MG 24 hr tablet TAKE ONE TABLET BY MOUTH EVERY DAY 30 tablet 6  . NEXIUM 40 MG capsule Take 40 mg by mouth daily at 12 noon.     . nitroGLYCERIN (NITROSTAT) 0.4 MG SL tablet Place 1 tablet (0.4 mg total) under the tongue every 5 (five) minutes as needed. May repeat for up to 3 doses. 25 tablet 3  . rosuvastatin (CRESTOR) 20 MG tablet  Take 20 mg by mouth daily.    . sitaGLIPtin (JANUVIA) 100 MG tablet Take 100 mg by mouth daily.     No current facility-administered medications for this visit.     No past surgical history on file.   No Known Allergies    Family History  Problem Relation Age of Onset  . Heart disease Other      Social History Mr. Cassedy reports that he quit smoking about 12 years ago. His smoking use included Cigarettes. He started smoking about 42 years ago. He has a 30 pack-year smoking history. He has never used smokeless tobacco. Mr. Doubek reports that he does not drink alcohol.   Review of Systems CONSTITUTIONAL: No weight loss, fever, chills, weakness or fatigue.  HEENT: Eyes: No visual loss, blurred vision, double vision or yellow sclerae.No hearing loss, sneezing, congestion, runny nose or sore throat.  SKIN: No rash or itching.  CARDIOVASCULAR: per HPI RESPIRATORY: No shortness of breath, cough or sputum.  GASTROINTESTINAL: No anorexia, nausea, vomiting or diarrhea. No abdominal pain or blood.  GENITOURINARY: No burning on urination, no polyuria NEUROLOGICAL: No headache, dizziness, syncope, paralysis, ataxia, numbness or tingling in the extremities. No change in bowel or bladder control.  MUSCULOSKELETAL: No muscle, back pain, joint pain or  stiffness.  LYMPHATICS: No enlarged nodes. No history of splenectomy.  PSYCHIATRIC: No history of depression or anxiety.  ENDOCRINOLOGIC: No reports of sweating, cold or heat intolerance. No polyuria or polydipsia.  Marland Kitchen.   Physical Examination p 93 bp 130/81 Wt 181 lbs BMI 28 Gen: resting comfortably, no acute distress HEENT: no scleral icterus, pupils equal round and reactive, no palptable cervical adenopathy,  CV: RRR, no m/r/g, no JVD, no carotid bruits Resp: Clear to auscultation bilaterally GI: abdomen is soft, non-tender, non-distended, normal bowel sounds, no hepatosplenomegaly MSK: extremities are warm, no edema.  Skin: warm, no  rash Neuro:  no focal deficits Psych: appropriate affect   Diagnostic Studies 03/27/14 Lexiscan MPI 1. Small mild inferolateral wall defect most likely secondary to sub- diaphragmatic attenuation, however cannot rule out mild inferolateral wall ischemia.  2. Normal left ventricular wall motion.  3. Left ventricular ejection fraction 59%  4. Low-risk stress test findings*.     Assessment and Plan  1. CAD  - has had some chest pain. Recent Lexiscan small area of subdiaphragmatic attenuation vs ischemia, overall low risk. Will start imdur as additional antianginal and follow symptoms.   F/u 2 months      Antoine PocheJonathan F. Callen Zuba, M.D.

## 2014-06-28 ENCOUNTER — Encounter: Payer: Medicare Other | Admitting: Cardiology

## 2014-06-28 NOTE — Progress Notes (Signed)
ERROR

## 2016-03-09 IMAGING — NM NM MYOCAR MULTI W/SPECT W/WALL MOTION & EF
2 series · 12 of 12 positions shown · non-contrast
Comparison: None.

CLINICAL DATA: 58-year-old male with a known history of coronary
artery disease referred for chest pain.

EXAM:
MYOCARDIAL IMAGING WITH SPECT (REST AND PHARMACOLOGIC-STRESS)
GATED LEFT VENTRICULAR WALL MOTION STUDY
LEFT VENTRICULAR EJECTION FRACTION
TECHNIQUE: Standard myocardial SPECT imaging was performed after resting
intravenous injection of 10 mCi 2c-JJm sestamibi. Subsequently,
intravenous infusion of Lexiscan was performed under the supervision
of the Cardiology staff. At peak effect of the drug, 30 mCi 2c-JJm
sestamibi was injected intravenously and standard myocardial SPECT
imaging was performed. Quantitative gated imaging was also performed
to evaluate left ventricular wall motion, and estimate left
ventricular ejection fraction.

[Series 1: rest · 8.28mm/px · 6 of 64 frames shown]
[frame 6/64]
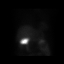
[frame 16/64]
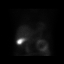
[frame 27/64]
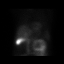
[frame 38/64]
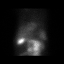
[frame 48/64]
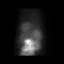
[frame 59/64]
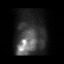

[Series 3: stress gated - perfusion · 8.28mm/px · 6 of 64 frames shown]
[frame 6/64]
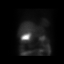
[frame 16/64]
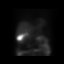
[frame 27/64]
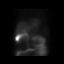
[frame 38/64]
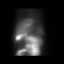
[frame 48/64]
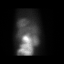
[frame 59/64]
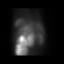

[12 of 12 positions shown; findings below may reference images not displayed]

FINDINGS: Pharmacological stress baseline EKG shows sinus rhythm. After
injection heart rate increased from 75 beats per min up to 93 beats
per min and blood pressure increased from 116/64 up to 125/67. The
test was stopped after injection was complete, the patient did not
experience any chest pain. Post-injection EKG showed no specific
ischemic changes and no significant arrhythmias.

Perfusion: There is a small in size mild intensity inferolateral
wall defect with very mild reversibility. The inferolateral wall has
normal wall motion. Findings are most suggestive of sub-
diaphragmatic attenuation, however cannot rule out mild
inferolateral wall ischemia.

Wall Motion: Normal left ventricular wall motion. No left
ventricular dilation.

Left Ventricular Ejection Fraction: 59 %

End diastolic volume 76 ml

End systolic volume 31 ml
IMPRESSION: 1. Small mild inferolateral wall defect most likely secondary to
sub- diaphragmatic attenuation, however cannot rule out mild
inferolateral wall ischemia.

2. Normal left ventricular wall motion.

3. Left ventricular ejection fraction 59%

4. Low-risk stress test findings*.

*8688 Appropriate Use Criteria for Coronary Revascularization
Focused Update: J Am Coll Cardiol. 8688;59(9):857-881.
[URL]

## 2018-10-07 ENCOUNTER — Encounter: Payer: Self-pay | Admitting: Internal Medicine

## 2018-11-21 ENCOUNTER — Ambulatory Visit: Payer: Medicare Other | Admitting: Nurse Practitioner

## 2018-11-21 ENCOUNTER — Other Ambulatory Visit: Payer: Self-pay

## 2018-11-21 ENCOUNTER — Encounter: Payer: Self-pay | Admitting: Nurse Practitioner

## 2018-11-21 DIAGNOSIS — R1310 Dysphagia, unspecified: Secondary | ICD-10-CM | POA: Diagnosis not present

## 2018-11-21 DIAGNOSIS — R1319 Other dysphagia: Secondary | ICD-10-CM

## 2018-11-21 NOTE — Progress Notes (Signed)
Primary Care Physician:  Monico Blitz, MD Primary Gastroenterologist:  Dr. Gala Romney  Chief Complaint  Patient presents with  . Dysphagia    food/pills, last dilation by Dr. Anthony Sar 2-3 yrs ago    HPI:   Ronnie Maynard is a 63 y.o. male who presents on referral from primary care for esophageal dilation.  Information provided with referral including visit 09/28/2018.  This is a follow-up on diabetes.  No mention of GI complaints at this time.   History of endoscopy or colonoscopy in our system.  Today he states EGD was 2 to 3 years ago by Dr. Anthony Sar in [place], [place]. He states this worked well for him initially. Began having recurrent solid food and pill dysphagia about 4-5 months ago. Dysphagia not with every meal, but is diet dependent such as meats, peanut butter sandwich, potato chips. Occasional regurgitation, sometimes passes with time and fluids. Sometimes if he tries to drink the fluids back up. Denies odynophagia, abdominal pain, N/V, hematochezia, melena, fever, chills, unintentional weight loss. Denies URI or flu-like symptoms. Denies loss of sense of taste or smell. Denies chest pain, dyspnea, dizziness, lightheadedness, syncope, near syncope. Denies any other upper or lower GI symptoms.  Had a colonoscopy 2 years ago at [facility].  Past Medical History:  Diagnosis Date  . CAD (coronary artery disease), native coronary artery   . Diabetes mellitus   . ED (erectile dysfunction)    On Levitra  . Hyperlipidemia, mixed   . Hypertension, essential, benign     Past Surgical History:  Procedure Laterality Date  . NO PAST SURGERIES      Current Outpatient Medications  Medication Sig Dispense Refill  . aspirin 81 MG tablet Take 81 mg by mouth daily.      . canagliflozin (INVOKANA) 300 MG TABS tablet Take 300 mg by mouth daily before breakfast.    . isosorbide mononitrate (IMDUR) 30 MG 24 hr tablet Take 1 tablet (30 mg total) by mouth daily. 90 tablet 3  . lisinopril  (PRINIVIL,ZESTRIL) 2.5 MG tablet Take 2.5 mg by mouth daily.    . metFORMIN (GLUCOPHAGE) 500 MG tablet Take 500 mg by mouth 2 (two) times daily with a meal.     . metoprolol (TOPROL-XL) 50 MG 24 hr tablet TAKE ONE TABLET BY MOUTH EVERY DAY 30 tablet 6  . NEXIUM 40 MG capsule Take 40 mg by mouth daily at 12 noon.     . nitroGLYCERIN (NITROSTAT) 0.4 MG SL tablet Place 1 tablet (0.4 mg total) under the tongue every 5 (five) minutes as needed. May repeat for up to 3 doses. 25 tablet 3  . rosuvastatin (CRESTOR) 20 MG tablet Take 20 mg by mouth daily.    . sitaGLIPtin (JANUVIA) 100 MG tablet Take 100 mg by mouth daily.     No current facility-administered medications for this visit.     Allergies as of 11/21/2018  . (No Known Allergies)    Family History  Problem Relation Age of Onset  . Heart disease Other   . Throat cancer Paternal Grandfather   . Colon cancer Neg Hx   . Esophageal cancer Neg Hx     Social History   Socioeconomic History  . Marital status: Single    Spouse name: Not on file  . Number of children: Not on file  . Years of education: Not on file  . Highest education level: Not on file  Occupational History  . Not on file  Social Needs  .  Financial resource strain: Not on file  . Food insecurity    Worry: Not on file    Inability: Not on file  . Transportation needs    Medical: Not on file    Non-medical: Not on file  Tobacco Use  . Smoking status: Former Smoker    Packs/day: 2.00    Years: 15.00    Pack years: 30.00    Types: Cigarettes    Start date: 03/20/1972    Quit date: 03/23/2002    Years since quitting: 16.6  . Smokeless tobacco: Never Used  Substance and Sexual Activity  . Alcohol use: Yes    Alcohol/week: 0.0 standard drinks    Comment: occ beer  . Drug use: Never  . Sexual activity: Not on file  Lifestyle  . Physical activity    Days per week: Not on file    Minutes per session: Not on file  . Stress: Not on file  Relationships  .  Social Musicianconnections    Talks on phone: Not on file    Gets together: Not on file    Attends religious service: Not on file    Active member of club or organization: Not on file    Attends meetings of clubs or organizations: Not on file    Relationship status: Not on file  . Intimate partner violence    Fear of current or ex partner: Not on file    Emotionally abused: Not on file    Physically abused: Not on file    Forced sexual activity: Not on file  Other Topics Concern  . Not on file  Social History Narrative   Single    Review of Systems: General: Negative for anorexia, weight loss, fever, chills, fatigue, weakness. ENT: Negative for hoarseness, difficulty swallowing. CV: Negative for chest pain, angina, palpitations, peripheral edema.  Respiratory: Negative for dyspnea at rest, cough, sputum, wheezing.  GI: See history of present illness. MS: Negative for joint pain, low back pain.  Derm: Negative for rash or itching.  Endo: Negative for unusual weight change.  Heme: Negative for bruising or bleeding. Allergy: Negative for rash or hives.    Physical Exam: BP 133/76   Pulse 62   Temp (!) 96.8 F (36 C) (Temporal)   Ht 5\' 9"  (1.753 m)   Wt 185 lb 9.6 oz (84.2 kg)   BMI 27.41 kg/m  General:   Alert and oriented. Pleasant and cooperative. Well-nourished and well-developed.  Head:  Normocephalic and atraumatic. Eyes:  Without icterus, sclera clear and conjunctiva pink.  Ears:  Normal auditory acuity. Cardiovascular:  S1, S2 present without murmurs appreciated. Extremities without clubbing or edema. Respiratory:  Clear to auscultation bilaterally. No wheezes, rales, or rhonchi. No distress.  Gastrointestinal:  +BS, soft, non-tender and non-distended. No HSM noted. No guarding or rebound. No masses appreciated.  Rectal:  Deferred  Musculoskalatal:  Symmetrical without gross deformities. Neurologic:  Alert and oriented x4;  grossly normal neurologically. Psych:  Alert  and cooperative. Normal mood and affect. Heme/Lymph/Immune: No excessive bruising noted.    11/21/2018 8:42 AM   Disclaimer: This note was dictated with voice recognition software. Similar sounding words can inadvertently be transcribed and may not be corrected upon review.

## 2018-11-21 NOTE — Patient Instructions (Addendum)
Your health issues we discussed today were:   Swallowing difficulties: 1. We will schedule your upper endoscopy with possible dilation for you 2. Printed information about foods to avoid and ways to prepare food to help prevent swallowing difficulties as below 3. Continue taking your Nexium daily 4. Call us if you have any worsening or severe symptoms 5. If food gets stuck and will not go down despite adequate time proceed to the emergency department 6. Recommendations to follow  Overall I recommend:  1. Continue your other current medications 2. Follow-up in 6 months 3. Call us if you have any questions or concerns.   Because of recent events of COVID-19 ("Coronavirus"), follow CDC recommendations:  Wash your hand frequently Avoid touching your face Stay away from people who are sick If you have symptoms such as fever, cough, shortness of breath then call your healthcare provider for further guidance If you are sick, STAY AT HOME unless otherwise directed by your healthcare provider. Follow directions from state and national officials regarding staying safe   At [facility] we value your feedback. You may receive a survey about your visit today. Please share your experience as we strive to create trusting relationships with our patients to provide genuine, compassionate, quality care.  We appreciate your understanding and patience as we review any laboratory studies, imaging, and other diagnostic tests that are ordered as we care for you. Our office policy is 5 business days for review of these results, and any emergent or urgent results are addressed in a timely manner for your best interest. If you do not hear from our office in 1 week, please contact us.   We also encourage the use of MyChart, which contains your medical information for your review as well. If you are not enrolled in this feature, an access code is on this after visit summary for your convenience.  Thank you for allowing [place] to be involved in your care.  It was great to see you today!  I hope you have a great summer!!      Dysphagia Eating Plan, Bite Size Food This diet plan is for people with moderate swallowing problems who have transitioned from pureed and minced foods. Bite size foods are soft and cut into small chunks so that they can be swallowed safely. On this eating plan, you may be instructed to drink liquids that are thickened. Work with your health care provider and your diet and nutrition specialist (dietitian) to make sure that you are following the diet safely and getting all the nutrients you need. What are tips for following this plan? General guidelines for foods   You may eat foods that are tender, soft, and moist.  Always test food texture before taking a bite. Poke food with a fork or spoon to make sure it is tender.  Food should be easy to cut and shew. Avoid large pieces of food that require a lot of chewing.  Take small bites. Each bite should be smaller than your thumb nail (about 31mm by 15 mm).  If you were on pureed and minced food diet plans, you may eat any of the foods included in those diets.  Avoid foods that are very dry, hard, sticky, chewy, coarse, or crunchy.  If instructed by your health care provider, thicken liquids. Follow your health care provider's instructions for what products to use, how to do this, and to what thickness. ? Your health care provider may recommend using a commercial thickener, rice  cereal, or potato flakes. Ask your health care provider to recommend thickeners. ? Thickened liquids are usually a "pudding-like" consistency, or they may be as thick as honey or thick enough to eat with a spoon. Cooking  To moisten foods, you may add liquids while you are blending, mashing, or grinding your foods to the right consistency. These liquids include gravies, sauces, vegetable or fruit juice, milk, half and half, or water.   Strain extra liquid from foods before eating.  Reheat foods slowly to prevent a tough crust from forming.  Prepare foods in advance. Meal planning  Eat a variety of foods to get all the nutrients you need.  Some foods may be tolerated better than others. Work with your dietitian to identify which foods are safest for you to eat.  Follow your meal plan as told by your dietitian. What foods are allowed? Grains Moist breads without nuts or seeds. Biscuits, muffins, pancakes, and waffles that are well-moistened with syrup, jelly, margarine, or butter. Cooked cereals. Moist bread stuffing. Moist rice. Well-moistened cold cereal with small chunks. Well-cooked pasta, noodles, rice, and bread dressing in small pieces and thick sauce. Soft dumplings or spaetzle in small pieces and butter or gravy. Vegetables Soft, well-cooked vegetables in small pieces. Soft-cooked, mashed potatoes. Thickened vegetable juice. Fruits Canned or cooked fruits that are soft or moist and do not have skin or seeds. Fresh, soft bananas. Thickened fruit juices. Meat and other protein foods Tender, moist meats or poultry in small pieces. Moist meatballs or meatloaf. Fish without bones. Eggs or egg substitutes in small pieces. Tofu. Tempeh and meat alternatives in small pieces. Well-cooked, tender beans, peas, baked beans, and other legumes. Dairy Thickened milk. Cream cheese. Yogurt. Cottage cheese. Sour cream. Small pieces of soft cheese. Fats and oils Butter. Oils. Margarine. Mayonnaise. Gravy. Spreads. Sweets and desserts Soft, smooth, moist desserts. Pudding. Custard. Moist cakes. Jam. Jelly. Honey. Preserves. Ask your health care provider whether you can have frozen desserts. Seasoning and other foods All seasonings and sweeteners. All sauces with small chunks. Prepared tuna, egg, or chicken salad without raw fruits or vegetables. Moist casseroles with small, tender pieces of meat. Soups with tender meat. What foods  are not allowed? Grains Coarse or dry cereals. Dry breads. Toast. Crackers. Tough, crusty breads, such as [place] bread and baguettes. Dry pancakes, waffles, and muffins. Sticky rice. Dry bread stuffing. Granola. Popcorn. Chips. Vegetables All raw vegetables. Cooked corn. Rubbery or stiff cooked vegetables. Stringy vegetables, such as celery. Tough, crisp fried potatoes. Potato skins. Fruits Hard, crunchy, stringy, high-pulp, and juicy raw fruits such as apples, pineapple, papaya, and watermelon. Small, round fruits, such as grapes. Dried fruit and fruit leather. Meat and other protein foods Large pieces of meat. Dry, tough meats, such as bacon, sausage, and hot dogs. Chicken, [place]key, or fish with skin and bones. Crunchy peanut butter. Nuts. Seeds. Nut and seed butters. Dairy Yogurt with nuts, seeds, or large chunks. Large chunks of cheese. Frozen desserts and milk consistency not allowed by your dietitian. Sweets and desserts Dry cakes. Chewy or dry cookies. Any desserts with nuts, seeds, dry fruits, coconut, pineapple, or anything dry, sticky, or hard. Chewy caramel. Licorice. Taffy-type candies. Ask your health care provider whether you can have frozen desserts. Seasoning and other foods Soups with tough or large chunks of meats, poultry, or vegetables. Corn or clam chowder. Smoothies with large chunks of fruit. Summary  Bite size foods can be helpful for people with moderate swallowing problems.  On the  dysphagia eating plan, you may eat foods that are soft, moist, and cut into pieces smaller than 44mm by 79mm.  You may be instructed to thicken liquids. Follow your health care provider's instructions about how to do this and to what consistency. This information is not intended to replace advice given to you by your health care provider. Make sure you discuss any questions you have with your health care provider. Document Released: 03/09/2005 Document Revised: 06/30/2018 Document Reviewed:  06/19/2016 Elsevier Patient Education  2020 [employer].

## 2018-11-21 NOTE — Assessment & Plan Note (Signed)
The patient has a history of solid food dysphagia.  Last EGD with dilation approximately 2 to 3 years ago at [facility].  We will request this report.  We will also request his last colonoscopy report from [facility] about 2 years ago.  He is having recurrent solid food and pill dysphagia for the past 5 months.  Previous endoscopies with dilation have worked well for him.  He does have chronic GERD but this is well managed on PPI.  At this point we will proceed with EGD with possible dilation.  Written instructions for dysphagia diet to be printed.  Follow-up in 6 months.  Proceed with EGD +/- dilation with Dr. Gala Romney in near future: the risks, benefits, and alternatives have been discussed with the patient in detail. The patient states understanding and desires to proceed.  Patient is currently on Invokana, Glucophage, Januvia.  No other anticoagulants, anxiolytics, chronic pain medications, or antidepressants.  Not on any iron supplements.  Conscious sedation should be adequate for his procedure.

## 2019-01-20 ENCOUNTER — Other Ambulatory Visit (HOSPITAL_COMMUNITY)
Admission: RE | Admit: 2019-01-20 | Discharge: 2019-01-20 | Disposition: A | Discharge status: Home / Self Care | Payer: Medicare Other | Source: Ambulatory Visit | Attending: Internal Medicine | Admitting: Internal Medicine

## 2019-01-20 ENCOUNTER — Other Ambulatory Visit: Payer: Self-pay

## 2019-01-20 DIAGNOSIS — Z20828 Contact with and (suspected) exposure to other viral communicable diseases: Secondary | ICD-10-CM | POA: Diagnosis not present

## 2019-01-20 DIAGNOSIS — Z01812 Encounter for preprocedural laboratory examination: Secondary | ICD-10-CM | POA: Diagnosis present

## 2019-01-20 LAB — SARS CORONAVIRUS 2 (TAT 6-24 HRS): SARS Coronavirus 2: NEGATIVE

## 2019-01-24 ENCOUNTER — Ambulatory Visit (HOSPITAL_COMMUNITY)
Admission: RE | Admit: 2019-01-24 | Discharge: 2019-01-24 | Disposition: A | Discharge status: Home / Self Care | Payer: Medicare Other | Source: Ambulatory Visit | Attending: Internal Medicine | Admitting: Internal Medicine

## 2019-01-24 ENCOUNTER — Encounter (HOSPITAL_COMMUNITY): Payer: Self-pay | Admitting: *Deleted

## 2019-01-24 ENCOUNTER — Encounter (HOSPITAL_COMMUNITY)
Admission: RE | Disposition: A | Discharge status: Home / Self Care | Payer: Self-pay | Source: Ambulatory Visit | Attending: Internal Medicine

## 2019-01-24 ENCOUNTER — Other Ambulatory Visit: Payer: Self-pay

## 2019-01-24 DIAGNOSIS — R0600 Dyspnea, unspecified: Secondary | ICD-10-CM | POA: Insufficient documentation

## 2019-01-24 DIAGNOSIS — F329 Major depressive disorder, single episode, unspecified: Secondary | ICD-10-CM | POA: Diagnosis not present

## 2019-01-24 DIAGNOSIS — I252 Old myocardial infarction: Secondary | ICD-10-CM | POA: Diagnosis not present

## 2019-01-24 DIAGNOSIS — E119 Type 2 diabetes mellitus without complications: Secondary | ICD-10-CM | POA: Diagnosis not present

## 2019-01-24 DIAGNOSIS — Z955 Presence of coronary angioplasty implant and graft: Secondary | ICD-10-CM | POA: Insufficient documentation

## 2019-01-24 DIAGNOSIS — Z87891 Personal history of nicotine dependence: Secondary | ICD-10-CM | POA: Insufficient documentation

## 2019-01-24 DIAGNOSIS — E782 Mixed hyperlipidemia: Secondary | ICD-10-CM | POA: Diagnosis not present

## 2019-01-24 DIAGNOSIS — Z791 Long term (current) use of non-steroidal anti-inflammatories (NSAID): Secondary | ICD-10-CM | POA: Diagnosis not present

## 2019-01-24 DIAGNOSIS — K3189 Other diseases of stomach and duodenum: Secondary | ICD-10-CM | POA: Diagnosis not present

## 2019-01-24 DIAGNOSIS — K2950 Unspecified chronic gastritis without bleeding: Secondary | ICD-10-CM | POA: Diagnosis not present

## 2019-01-24 DIAGNOSIS — Z800 Family history of malignant neoplasm of digestive organs: Secondary | ICD-10-CM | POA: Insufficient documentation

## 2019-01-24 DIAGNOSIS — I2510 Atherosclerotic heart disease of native coronary artery without angina pectoris: Secondary | ICD-10-CM | POA: Diagnosis not present

## 2019-01-24 DIAGNOSIS — K259 Gastric ulcer, unspecified as acute or chronic, without hemorrhage or perforation: Secondary | ICD-10-CM | POA: Insufficient documentation

## 2019-01-24 DIAGNOSIS — R1310 Dysphagia, unspecified: Secondary | ICD-10-CM

## 2019-01-24 DIAGNOSIS — Z7984 Long term (current) use of oral hypoglycemic drugs: Secondary | ICD-10-CM | POA: Insufficient documentation

## 2019-01-24 DIAGNOSIS — K2990 Gastroduodenitis, unspecified, without bleeding: Secondary | ICD-10-CM | POA: Diagnosis not present

## 2019-01-24 DIAGNOSIS — Z79899 Other long term (current) drug therapy: Secondary | ICD-10-CM | POA: Diagnosis not present

## 2019-01-24 DIAGNOSIS — F419 Anxiety disorder, unspecified: Secondary | ICD-10-CM | POA: Insufficient documentation

## 2019-01-24 DIAGNOSIS — I10 Essential (primary) hypertension: Secondary | ICD-10-CM | POA: Diagnosis not present

## 2019-01-24 DIAGNOSIS — Z8249 Family history of ischemic heart disease and other diseases of the circulatory system: Secondary | ICD-10-CM | POA: Diagnosis not present

## 2019-01-24 DIAGNOSIS — R1314 Dysphagia, pharyngoesophageal phase: Secondary | ICD-10-CM | POA: Insufficient documentation

## 2019-01-24 DIAGNOSIS — J449 Chronic obstructive pulmonary disease, unspecified: Secondary | ICD-10-CM | POA: Insufficient documentation

## 2019-01-24 DIAGNOSIS — K2210 Ulcer of esophagus without bleeding: Secondary | ICD-10-CM | POA: Insufficient documentation

## 2019-01-24 DIAGNOSIS — K2100 Gastro-esophageal reflux disease with esophagitis, without bleeding: Secondary | ICD-10-CM | POA: Diagnosis not present

## 2019-01-24 DIAGNOSIS — Z7982 Long term (current) use of aspirin: Secondary | ICD-10-CM | POA: Insufficient documentation

## 2019-01-24 DIAGNOSIS — R1319 Other dysphagia: Secondary | ICD-10-CM

## 2019-01-24 HISTORY — DX: Depression, unspecified: F32.A

## 2019-01-24 HISTORY — DX: Dyspnea, unspecified: R06.00

## 2019-01-24 HISTORY — PX: BIOPSY: SHX5522

## 2019-01-24 HISTORY — DX: Anxiety disorder, unspecified: F41.9

## 2019-01-24 HISTORY — DX: Chronic obstructive pulmonary disease, unspecified: J44.9

## 2019-01-24 HISTORY — DX: Gastro-esophageal reflux disease without esophagitis: K21.9

## 2019-01-24 HISTORY — DX: Acute myocardial infarction, unspecified: I21.9

## 2019-01-24 HISTORY — PX: ESOPHAGOGASTRODUODENOSCOPY: SHX5428

## 2019-01-24 HISTORY — PX: MALONEY DILATION: SHX5535

## 2019-01-24 LAB — GLUCOSE, CAPILLARY: Glucose-Capillary: 147 mg/dL — ABNORMAL HIGH (ref 70–99)

## 2019-01-24 SURGERY — EGD (ESOPHAGOGASTRODUODENOSCOPY)
Anesthesia: Moderate Sedation

## 2019-01-24 MED ORDER — MIDAZOLAM HCL 5 MG/5ML IJ SOLN
INTRAMUSCULAR | Status: DC | PRN
  Administered 2019-01-24 (×3): 1 mg via INTRAVENOUS
  Administered 2019-01-24: 2 mg via INTRAVENOUS
  Administered 2019-01-24: 1 mg via INTRAVENOUS

## 2019-01-24 MED ORDER — MEPERIDINE HCL 100 MG/ML IJ SOLN
INTRAMUSCULAR | Status: DC | PRN
  Administered 2019-01-24: 15 mg via INTRAVENOUS
  Administered 2019-01-24: 25 mg via INTRAVENOUS
  Administered 2019-01-24: 10 mg via INTRAVENOUS

## 2019-01-24 MED ORDER — STERILE WATER FOR IRRIGATION IR SOLN
Status: DC | PRN
  Administered 2019-01-24: 2.5 mL

## 2019-01-24 MED ORDER — MIDAZOLAM HCL 5 MG/5ML IJ SOLN
INTRAMUSCULAR | Status: AC
  Filled 2019-01-24: qty 10

## 2019-01-24 MED ORDER — ONDANSETRON HCL 4 MG/2ML IJ SOLN
INTRAMUSCULAR | Status: DC | PRN
  Administered 2019-01-24: 4 mg via INTRAVENOUS

## 2019-01-24 MED ORDER — MEPERIDINE HCL 50 MG/ML IJ SOLN
INTRAMUSCULAR | Status: AC
  Filled 2019-01-24: qty 1

## 2019-01-24 MED ORDER — LIDOCAINE VISCOUS HCL 2 % MT SOLN
OROMUCOSAL | Status: AC
  Filled 2019-01-24: qty 15

## 2019-01-24 MED ORDER — ONDANSETRON HCL 4 MG/2ML IJ SOLN
INTRAMUSCULAR | Status: AC
  Filled 2019-01-24: qty 2

## 2019-01-24 MED ORDER — SODIUM CHLORIDE 0.9 % IV SOLN
INTRAVENOUS | Status: DC
  Administered 2019-01-24: 07:00:00 1000 mL via INTRAVENOUS

## 2019-01-24 NOTE — Op Note (Signed)
[facility] Patient Name: Ronnie Maynard Procedure Date: 01/24/2019 7:09 AM MRN: [id number] Date of Birth: 01-04-1956 Attending MD: Gennette Pac , MD CSN: [id number] Age: 63 Admit Type: Outpatient Procedure:                Upper GI endoscopy Indications:              Dysphagia, Suspected esophageal reflux Providers:                Gennette Pac, MD, Criselda Peaches. Mathis Fare RN, RN,                            Burke Keels, Technician Referring MD:              Medicines:                Midazolam 6 mg IV, Meperidine 50 mg IV, Ondansetron                            4 mg IV Complications:            No immediate complications. Estimated Blood Loss:     Estimated blood loss was minimal. Procedure:                Pre-Anesthesia Assessment:                           - Prior to the procedure, a History and Physical                            was performed, and patient medications and                            allergies were reviewed. The patient's tolerance of                            previous anesthesia was also reviewed. The risks                            and benefits of the procedure and the sedation                            options and risks were discussed with the patient.                            All questions were answered, and informed consent                            was obtained. Prior Anticoagulants: The patient has                            taken no previous anticoagulant or antiplatelet                            agents. ASA Grade Assessment: II - A patient with  mild systemic disease. After reviewing the risks                            and benefits, the patient was deemed in                            satisfactory condition to undergo the procedure.                           After obtaining informed consent, the endoscope was                            passed under direct vision. Throughout the   procedure, the patient's blood pressure, pulse, and                            oxygen saturations were monitored continuously. The                            GIF-H190 ([id number]) was introduced through the                            mouth, and advanced to the second part of duodenum. Scope In: 7:47:10 AM Scope Out: 7:58:08 AM Total Procedure Duration: 0 hours 10 minutes 58 seconds  Findings:      Esophagitis was found. Four-quadrant distal esophageal erosions with       overlying exudate extending up 3 to 4 cm from the GE junction. 2 cm       "tongue" of salmon-colored epithelium coming up from the GE junction.       Mild narrowing of the tubular esophagus distally without focal stricture       seen. The adult scope traversed the tubular esophagus without any       difficulty whatsoever. Antral erosions present in the stomach. No ulcer       or infiltrating process. Pylorus patent and easily traversed.       Retroflexed the GE junction revealed no abnormalities.      he duodenal bulb and second portion of the duodenum were normal. The       scope was withdrawn. Dilation was performed with a Maloney dilator with       mild resistance at 1 Fr. Initially, I attempted to pass a 48 [place]       Maloney dilator I could not initiate passage. Subsequently, went with       the 33 [place] dilator. The dilation site was examined following       endoscope reinsertion and showed moderate improvement in luminal       narrowing. Finally, salmon-colored esophageal mucosa was biopsied as       well as the abnormal gastric mucosa. Impression:               -Fairly severe, extensive, erosive reflux                            esophagitis. Gastric erosions. Status post [facility]                            dilation.  Mucosal changes of the distal                            esophagus?query Barrett's?status post biopsy of                            stomach and esophagus. Normal duodenal bulb and                             second portion of the duodenum.                           - No specimens collected. Inadequate control of                            reflux on once daily Nexium regimen Moderate Sedation:      Moderate (conscious) sedation was administered by the endoscopy nurse       and supervised by the endoscopist. The following parameters were       monitored: oxygen saturation, heart rate, blood pressure, respiratory       rate, EKG, adequacy of pulmonary ventilation, and response to care.       Total physician intraservice time was 23 minutes. Recommendation:           - Patient has a contact number available for                            emergencies. The signs and symptoms of potential                            delayed complications were discussed with the                            patient. Return to normal activities tomorrow.                            Written discharge instructions were provided to the                            patient.                           - Advance diet as tolerated. Increase Nexium to 40                            mg twice daily. Follow-up on pathology. Office                            visit with us in 6 months Procedure Code(s):        --- Professional ---                           [id number], Esophagogastroduodenoscopy, flexible,                            transoral; diagnostic, including collection of  specimen(s) by brushing or washing, when performed                            (separate procedure)                           43450, Dilation of esophagus, by unguided sound or                            bougie, single or multiple passes                           99153, Moderate sedation; each additional 15                            minutes intraservice time                           G0500, Moderate sedation services provided by the                            same physician or other qualified health care                             professional performing a gastrointestinal                            endoscopic service that sedation supports,                            requiring the presence of an independent trained                            observer to assist in the monitoring of the                            patient's level of consciousness and physiological                            status; initial 15 minutes of intra-service time;                            patient age 16 years or older (additional time may                            be reported with 45409, as appropriate) Diagnosis Code(s):        --- Professional ---                           K20.90, Esophagitis, unspecified without bleeding                           K31.89, Other diseases of stomach and duodenum  R13.10, Dysphagia, unspecified CPT copyright 2019 American Medical Association. All rights reserved. The codes documented in this report are preliminary and upon coder review may  be revised to meet current compliance requirements. [name]. Chanin Frumkin, MD Gennette Pac, MD 01/24/2019 8:36:16 AM This report has been signed electronically. Number of Addenda: 0

## 2019-01-24 NOTE — H&P (Signed)
@LOGO @   Primary Care Physician:  , MD Primary Gastroenterologist:  Dr. Kirstie Peri  Pre-Procedure History & Physical: HPI:  Ronnie Maynard is a 63 y.o. male here for recurrent esophageal dysphagia.  History of stenosis dilated by Dr. 77 3 years ago.  GERD well-controlled on Nexium 40 mg daily.  Past Medical History:  Diagnosis Date  . Anxiety   . CAD (coronary artery disease), native coronary artery    S/P 2 stents in 2004  . COPD (chronic obstructive pulmonary disease) (HCC)   . Depression   . Diabetes mellitus   . Dyspnea   . ED (erectile dysfunction)    On Levitra  . GERD (gastroesophageal reflux disease)   . Hyperlipidemia, mixed   . Hypertension, essential, benign   . Myocardial infarction [facility])     Past Surgical History:  Procedure Laterality Date  . ESOPHAGOGASTRODUODENOSCOPY    . NO PAST SURGERIES      Prior to Admission medications   Medication Sig Start Date End Date Taking? Authorizing Provider  aspirin 81 MG tablet Take 81 mg by mouth daily.     Yes [provider]  canagliflozin (INVOKANA) 300 MG TABS tablet Take 300 mg by mouth daily before breakfast.   Yes [provider]  DULoxetine (CYMBALTA) 30 MG capsule Take 30 mg by mouth daily.   Yes [provider]  gabapentin (NEURONTIN) 300 MG capsule Take 300 mg by mouth 3 (three) times daily.   Yes [provider]  ibuprofen (ADVIL) 800 MG tablet Take 800 mg by mouth every 8 (eight) hours as needed.   Yes [provider]  isosorbide mononitrate (IMDUR) 30 MG 24 hr tablet Take 1 tablet (30 mg total) by mouth daily. 04/24/14  Yes 06/23/14, MD  lisinopril (PRINIVIL,ZESTRIL) 2.5 MG tablet Take 2.5 mg by mouth daily.   Yes [provider]  metFORMIN (GLUCOPHAGE) 500 MG tablet Take 500 mg by mouth 2 (two) times daily with a meal.    Yes [provider]  metoprolol (TOPROL-XL) 50 MG 24 hr tablet TAKE ONE TABLET BY MOUTH EVERY DAY 09/17/10  Yes  de 09/19/10, MD  omeprazole (PRILOSEC) 40 MG capsule Take 40 mg by mouth daily.   Yes [provider]  rosuvastatin (CRESTOR) 20 MG tablet Take 20 mg by mouth daily.   Yes [provider]  sitaGLIPtin (JANUVIA) 100 MG tablet Take 100 mg by mouth daily.   Yes [provider]  temazepam (RESTORIL) 30 MG capsule Take 30 mg by mouth at bedtime as needed for sleep.   Yes [provider]  NEXIUM 40 MG capsule Take 40 mg by mouth daily at 12 noon.  01/23/13   [provider]  nitroGLYCERIN (NITROSTAT) 0.4 MG SL tablet Place 1 tablet (0.4 mg total) under the tongue every 5 (five) minutes as needed. May repeat for up to 3 doses. 03/07/14   03/09/14, MD    Allergies as of 11/21/2018  . (No Known Allergies)    Family History  Problem Relation Age of Onset  . Heart disease Other   . Throat cancer Paternal Grandfather   . Colon cancer Neg Hx   . Esophageal cancer Neg Hx     Social History   Socioeconomic History  . Marital status: Single    Spouse name: Not on file  . Number of children: Not on file  . Years of education: Not on file  . Highest education level: Not  on file  Occupational History  . Not on file  Social Needs  . Financial resource strain: Not on file  . Food insecurity    Worry: Not on file    Inability: Not on file  . Transportation needs    Medical: Not on file    Non-medical: Not on file  Tobacco Use  . Smoking status: Former Smoker    Packs/day: 2.00    Years: 15.00    Pack years: 30.00    Types: Cigarettes    Start date: 03/20/1972    Quit date: 03/23/2002    Years since quitting: 16.8  . Smokeless tobacco: Never Used  Substance and Sexual Activity  . Alcohol use: Yes    Alcohol/week: 0.0 standard drinks    Comment: occ beer  . Drug use: Never  . Sexual activity: Not on file  Lifestyle  . Physical activity    Days per week: Not on file    Minutes per session: Not on file  . Stress: Not on file   Relationships  . Social Herbalist on phone: Not on file    Gets together: Not on file    Attends religious service: Not on file    Active member of club or organization: Not on file    Attends meetings of clubs or organizations: Not on file    Relationship status: Not on file  . Intimate partner violence    Fear of current or ex partner: Not on file    Emotionally abused: Not on file    Physically abused: Not on file    Forced sexual activity: Not on file  Other Topics Concern  . Not on file  Social History Narrative   Single    Review of Systems: See HPI, otherwise negative ROS  Physical Exam: BP 124/69   Pulse 61   Temp 98.5 F (36.9 C) (Oral)   Resp 13   Ht 5\' 10"  (1.778 m)   Wt 85.7 kg   SpO2 97%   BMI 27.12 kg/m  General:   Alert,  Well-developed, well-nourished, pleasant and cooperative in NAD Neck:  Supple; no masses or thyromegaly. No significant cervical adenopathy. Lungs:  Clear throughout to auscultation.   No wheezes, crackles, or rhonchi. No acute distress. Heart:  Regular rate and rhythm; no murmurs, clicks, rubs,  or gallops. Abdomen: Non-distended, normal bowel sounds.  Soft and nontender without appreciable mass or hepatosplenomegaly.  Pulses:  Normal pulses noted. Extremities:  Without clubbing or edema.  Impression/Plan: 63 year old gentleman with recurrent esophageal dysphagia.  Longstanding GERD.  EGD with esophageal dilation as feasible/appropriate per plan.  The risks, benefits, limitations, alternatives and imponderables have been reviewed with the patient. Potential for esophageal dilation, biopsy, etc. have also been reviewed.  Questions have been answered. All parties agreeable.     Notice: This dictation was prepared with Dragon dictation along with smaller phrase technology. Any transcriptional errors that result from this process are unintentional and may not be corrected upon review.

## 2019-01-24 NOTE — Discharge Instructions (Signed)
Gastroesophageal Reflux Disease, Adult Gastroesophageal reflux (GER) happens when acid from the stomach flows up into the tube that connects the mouth and the stomach (esophagus). Normally, food travels down the esophagus and stays in the stomach to be digested. With GER, food and stomach acid sometimes move back up into the esophagus. You may have a disease called gastroesophageal reflux disease (GERD) if the reflux: Happens often. Causes frequent or very bad symptoms. Causes problems such as damage to the esophagus. When this happens, the esophagus becomes sore and swollen (inflamed). Over time, GERD can make small holes (ulcers) in the lining of the esophagus. What are the causes? This condition is caused by a problem with the muscle between the esophagus and the stomach. When this muscle is weak or not normal, it does not close properly to keep food and acid from coming back up from the stomach. The muscle can be weak because of: Tobacco use. Pregnancy. Having a certain type of hernia (hiatal hernia). Alcohol use. Certain foods and drinks, such as coffee, chocolate, onions, and peppermint. What increases the risk? You are more likely to develop this condition if you: Are overweight. Have a disease that affects your connective tissue. Use NSAID medicines. What are the signs or symptoms? Symptoms of this condition include: Heartburn. Difficult or painful swallowing. The feeling of having a lump in the throat. A bitter taste in the mouth. Bad breath. Having a lot of saliva. Having an upset or bloated stomach. Belching. Chest pain. Different conditions can cause chest pain. Make sure you see your doctor if you have chest pain. Shortness of breath or noisy breathing (wheezing). Ongoing (chronic) cough or a cough at night. Wearing away of the surface of teeth (tooth enamel). Weight loss. How is this treated? Treatment will depend on how bad your symptoms are. Your doctor may  suggest: Changes to your diet. Medicine. Surgery. Follow these instructions at home: Eating and drinking  Follow a diet as told by your doctor. You may need to avoid foods and drinks such as: Coffee and tea (with or without caffeine). Drinks that contain alcohol. Energy drinks and sports drinks. Bubbly (carbonated) drinks or sodas. Chocolate and cocoa. Peppermint and mint flavorings. Garlic and onions. Horseradish. Spicy and acidic foods. These include peppers, chili powder, curry powder, vinegar, hot sauces, and BBQ sauce. Citrus fruit juices and citrus fruits, such as oranges, lemons, and limes. Tomato-based foods. These include red sauce, chili, salsa, and pizza with red sauce. Fried and fatty foods. These include donuts, french fries, potato chips, and high-fat dressings. High-fat meats. These include hot dogs, rib eye steak, sausage, ham, and bacon. High-fat dairy items, such as whole milk, butter, and cream cheese. Eat small meals often. Avoid eating large meals. Avoid drinking large amounts of liquid with your meals. Avoid eating meals during the 2-3 hours before bedtime. Avoid lying down right after you eat. Do not exercise right after you eat. Lifestyle  Do not use any products that contain nicotine or tobacco. These include cigarettes, e-cigarettes, and chewing tobacco. If you need help quitting, ask your doctor. Try to lower your stress. If you need help doing this, ask your doctor. If you are overweight, lose an amount of weight that is healthy for you. Ask your doctor about a safe weight loss goal. General instructions Pay attention to any changes in your symptoms. Take over-the-counter and prescription medicines only as told by your doctor. Do not take aspirin, ibuprofen, or other NSAIDs unless your doctor says it  is okay. Wear loose clothes. Do not wear anything tight around your waist. Raise (elevate) the head of your bed about 6 inches (15 cm). Avoid bending over  if this makes your symptoms worse. Keep all follow-up visits as told by your doctor. This is important. Contact a doctor if: You have new symptoms. You lose weight and you do not know why. You have trouble swallowing or it hurts to swallow. You have wheezing or a cough that keeps happening. Your symptoms do not get better with treatment. You have a hoarse voice. Get help right away if: You have pain in your arms, neck, jaw, teeth, or back. You feel sweaty, dizzy, or light-headed. You have chest pain or shortness of breath. You throw up (vomit) and your throw-up looks like blood or coffee grounds. You pass out (faint). Your poop (stool) is bloody or black. You cannot swallow, drink, or eat. Summary If a person has gastroesophageal reflux disease (GERD), food and stomach acid move back up into the esophagus and cause symptoms or problems such as damage to the esophagus. Treatment will depend on how bad your symptoms are. Follow a diet as told by your doctor. Take all medicines only as told by your doctor. This information is not intended to replace advice given to you by your health care provider. Make sure you discuss any questions you have with your health care provider. Document Released: 08/26/2007 Document Revised: 09/15/2017 Document Reviewed: 09/15/2017 Elsevier Patient Education  [place]. EGD Discharge instructions Please read the instructions outlined below and refer to this sheet in the next few weeks. These discharge instructions provide you with general information on caring for yourself after you leave the hospital. Your doctor may also give you specific instructions. While your treatment has been planned according to the most current medical practices available, unavoidable complications occasionally occur. If you have any problems or questions after discharge, please call your doctor. ACTIVITY  You may resume your regular activity but move at a slower pace for  the next 24 hours.   Take frequent rest periods for the next 24 hours.   Walking will help expel (get rid of) the air and reduce the bloated feeling in your abdomen.   No driving for 24 hours (because of the anesthesia (medicine) used during the test).   You may shower.   Do not sign any important legal documents or operate any machinery for 24 hours (because of the anesthesia used during the test).  NUTRITION  Drink plenty of fluids.   You may resume your normal diet.   Begin with a light meal and progress to your normal diet.   Avoid alcoholic beverages for 24 hours or as instructed by your caregiver.  MEDICATIONS  You may resume your normal medications unless your caregiver tells you otherwise.  WHAT YOU CAN EXPECT TODAY  You may experience abdominal discomfort such as a feeling of fullness or gas pains.  FOLLOW-UP  Your doctor will discuss the results of your test with you.  SEEK IMMEDIATE MEDICAL ATTENTION IF ANY OF THE FOLLOWING OCCUR:  Excessive nausea (feeling sick to your stomach) and/or vomiting.   Severe abdominal pain and distention (swelling).   Trouble swallowing.   Temperature over 101 F (37.8 C).   Rectal bleeding or vomiting of blood.    GERD information  Increase Nexium to 40 mg twice daily  Further recommendations to follow pending review of pathology report  Office visit with [place] in 6 months  At  patient request, I called his niece Ismar Yabut at [phone] findings and recommendations

## 2019-01-25 LAB — SURGICAL PATHOLOGY

## 2019-01-26 ENCOUNTER — Encounter: Payer: Self-pay | Admitting: Internal Medicine

## 2019-01-27 ENCOUNTER — Encounter (HOSPITAL_COMMUNITY): Payer: Self-pay | Admitting: Internal Medicine

## 2019-04-11 ENCOUNTER — Other Ambulatory Visit: Payer: Self-pay

## 2019-04-11 ENCOUNTER — Ambulatory Visit: Payer: Medicare Other | Attending: Internal Medicine

## 2019-04-11 DIAGNOSIS — Z20822 Contact with and (suspected) exposure to covid-19: Secondary | ICD-10-CM

## 2019-04-12 LAB — NOVEL CORONAVIRUS, NAA: SARS-CoV-2, NAA: DETECTED — AB

## 2019-04-13 ENCOUNTER — Telehealth: Payer: Self-pay | Admitting: Nurse Practitioner

## 2019-04-13 NOTE — Telephone Encounter (Signed)
Called to Discuss with patient about Covid symptoms and the use of bamlanivimab, a monoclonal antibody infusion for those with mild to moderate Covid symptoms and at a high risk of hospitalization.     Pt is qualified for this infusion at the [facility] due to co-morbid conditions and/or a member of an at-risk group.     Unable to reach pt  

## 2019-05-23 ENCOUNTER — Ambulatory Visit: Payer: Medicare Other | Admitting: Nurse Practitioner

## 2019-07-10 ENCOUNTER — Other Ambulatory Visit: Payer: Self-pay | Admitting: Internal Medicine

## 2019-07-25 ENCOUNTER — Ambulatory Visit: Payer: Medicare Other | Admitting: Gastroenterology

## 2019-07-25 ENCOUNTER — Telehealth: Payer: Self-pay | Admitting: Internal Medicine

## 2019-07-25 ENCOUNTER — Encounter: Payer: Self-pay | Admitting: Internal Medicine

## 2019-07-25 ENCOUNTER — Encounter: Payer: Self-pay | Admitting: Gastroenterology

## 2019-07-25 NOTE — Telephone Encounter (Signed)
Patient was a no show and letter sent  °

## 2019-07-25 NOTE — Progress Notes (Deleted)
EGD Nov 2020: severe, extensive erosive reflux esophagitis, gastric erosions, s/p dilation. Negative Barrett's. Colonoscopy in 2013 at [facility]: sessile polyp found, 2 small polyps descending and sigmoid s/p removal. Path not available.

## 2019-08-29 ENCOUNTER — Ambulatory Visit: Payer: Medicare Other | Admitting: Gastroenterology

## 2019-08-29 ENCOUNTER — Other Ambulatory Visit: Payer: Self-pay

## 2019-08-29 ENCOUNTER — Encounter: Payer: Self-pay | Admitting: Gastroenterology

## 2019-08-29 VITALS — BP 125/66 | HR 65 | Temp 97.1°F | Ht 69.0 in | Wt 180.2 lb

## 2019-08-29 DIAGNOSIS — K219 Gastro-esophageal reflux disease without esophagitis: Secondary | ICD-10-CM | POA: Diagnosis not present

## 2019-08-29 DIAGNOSIS — R195 Other fecal abnormalities: Secondary | ICD-10-CM | POA: Insufficient documentation

## 2019-08-29 DIAGNOSIS — R369 Urethral discharge, unspecified: Secondary | ICD-10-CM

## 2019-08-29 NOTE — Progress Notes (Signed)
Referring Provider: Kirstie Peri, MD Primary Care Physician:  Kirstie Peri, MD  Primary GI: Dr. Jena Gauss   Chief Complaint  Patient presents with   Dysphagia    occas bread will get stuck and then will cough it up   + stool test    had done by PCP and was called friday wtih results. reports needs TCS. Last done 3-4 years ago and had polyps removed Dr. Teena Dunk in [place]    HPI:   Ronnie Maynard is a 64 y.o. male presenting today with a history of GERD, dysphagia, with last EGD in Nov 2020 revealing severe, extensive erosive reflux esophagitis, gastric erosions, dilation with Elease Hashimoto, possible Barrett's s/p biopsy. Negative Barrett's. Colonoscopy 2013 at [facility], sessile polyp found 2 small polyps descending and sigmoid s/p removal, hyerpplastic.   Dysphagia improved. Nexium BID. Still has trouble with breads. No abdominal pain.  Stool test was heme positive through PCP. Some constipation occasionally. States occasionally he will have blood with wiping. Chronic. BM every other day. Feels ok with this. No straining.   He notes penile discharge chronically. States he has mentioned this to PCP. Bothersome. Wants referral to have this addressed.   Past Medical History:  Diagnosis Date   Anxiety    CAD (coronary artery disease), native coronary artery    S/P 2 stents in 2004   COPD (chronic obstructive pulmonary disease) (HCC)    Depression    Diabetes mellitus    Dyspnea    ED (erectile dysfunction)    On Levitra   GERD (gastroesophageal reflux disease)    Hyperlipidemia, mixed    Hypertension, essential, benign    Myocardial infarction [facility])     Past Surgical History:  Procedure Laterality Date   BIOPSY  01/24/2019   Procedure: BIOPSY;  Surgeon: Corbin Ade, MD;  [facility];  Service: Endoscopy;;  gastric esophagus    ESOPHAGOGASTRODUODENOSCOPY     ESOPHAGOGASTRODUODENOSCOPY N/A 01/24/2019   severe, extensive erosive reflux esophagitis, gastric  erosions, s/p dilation. Negative Barrett's.    MALONEY DILATION N/A 01/24/2019   Procedure: Elease Hashimoto DILATION;  Surgeon: Corbin Ade, MD;  [facility];  Service: Endoscopy;  Laterality: N/A;   NO PAST SURGERIES      Current Outpatient Medications  Medication Sig Dispense Refill   aspirin 81 MG tablet Take 81 mg by mouth daily.       canagliflozin (INVOKANA) 300 MG TABS tablet Take 300 mg by mouth daily before breakfast.     DULoxetine (CYMBALTA) 30 MG capsule Take 30 mg by mouth daily.     esomeprazole (NEXIUM) 40 MG capsule TAKE 1 CAPSULE BY MOUTH TWICE A DAY 180 capsule 1   fluconazole (DIFLUCAN) 150 MG tablet Take 150 mg by mouth daily.     gabapentin (NEURONTIN) 300 MG capsule Take 300 mg by mouth 3 (three) times daily.     ibuprofen (ADVIL) 800 MG tablet Take 800 mg by mouth every 8 (eight) hours as needed.     isosorbide mononitrate (IMDUR) 30 MG 24 hr tablet Take 1 tablet (30 mg total) by mouth daily. 90 tablet 3   lisinopril (PRINIVIL,ZESTRIL) 2.5 MG tablet Take 2.5 mg by mouth daily.     metFORMIN (GLUCOPHAGE) 500 MG tablet Take 500 mg by mouth 2 (two) times daily with a meal.      metoprolol (TOPROL-XL) 50 MG 24 hr tablet TAKE ONE TABLET BY MOUTH EVERY DAY (Patient taking differently: Take 75 mg  by mouth daily. ) 30 tablet 6   nitroGLYCERIN (NITROSTAT) 0.4 MG SL tablet Place 1 tablet (0.4 mg total) under the tongue every 5 (five) minutes as needed. May repeat for up to 3 doses. 25 tablet 3   rosuvastatin (CRESTOR) 20 MG tablet Take 20 mg by mouth daily.     sitaGLIPtin (JANUVIA) 100 MG tablet Take 100 mg by mouth daily.     temazepam (RESTORIL) 30 MG capsule Take 30 mg by mouth at bedtime as needed for sleep.     omeprazole (PRILOSEC) 40 MG capsule Take 40 mg by mouth daily.     No current facility-administered medications for this visit.    Allergies as of 08/29/2019   (No Known Allergies)    Family History  Problem Relation Age of Onset     Heart disease Other    Throat cancer Paternal Grandfather    Colon cancer Neg Hx    Esophageal cancer Neg Hx     Social History   Socioeconomic History   Marital status: Single    Spouse name: Not on file   Number of children: Not on file   Years of education: Not on file   Highest education level: Not on file  Occupational History   Not on file  Tobacco Use   Smoking status: Former Smoker    Packs/day: 2.00    Years: 15.00    Pack years: 30.00    Types: Cigarettes    Start date: 03/20/1972    Quit date: 03/23/2002    Years since quitting: 17.4   Smokeless tobacco: Never Used  Substance and Sexual Activity   Alcohol use: Yes    Alcohol/week: 0.0 standard drinks    Comment: occ beer   Drug use: Never   Sexual activity: Not on file  Other Topics Concern   Not on file  Social History Narrative   Single   Social Determinants of Health   Financial Resource Strain:    Difficulty of Paying Living Expenses:   Food Insecurity:    Worried About Programme researcher, broadcasting/film/video in the Last Year:    Barista in the Last Year:   Transportation Needs:    Freight forwarder (Medical):    Lack of Transportation (Non-Medical):   Physical Activity:    Days of Exercise per Week:    Minutes of Exercise per Session:   Stress:    Feeling of Stress :   Social Connections:    Frequency of Communication with Friends and Family:    Frequency of Social Gatherings with Friends and Family:    Attends Religious Services:    Active Member of Clubs or Organizations:    Attends Banker Meetings:    Marital Status:     Review of Systems: Gen: Denies fever, chills, anorexia. Denies fatigue, weakness, weight loss.  CV: Denies chest pain, palpitations, syncope, peripheral edema, and claudication. Resp: Denies dyspnea at rest, cough, wheezing, coughing up blood, and pleurisy. GI: see HPI Derm: Denies rash, itching, dry skin Psych: Denies  depression, anxiety, memory loss, confusion. No homicidal or suicidal ideation.  Heme: Denies bruising, bleeding, and enlarged lymph nodes.  Physical Exam: BP 125/66    Pulse 65    Temp (!) 97.1 F (36.2 C) (Oral)    Ht 5\' 9"  (1.753 m)    Wt 180 lb 3.2 oz (81.7 kg)    BMI 26.61 kg/m  General:   Alert and oriented. No distress noted. Pleasant  and cooperative.  Head:  Normocephalic and atraumatic. Eyes:  Conjuctiva clear without scleral icterus. Mouth:  Mask in place Lungs: clear bilaterally Cardiac: S1 S2 present  Abdomen:  +BS, soft, non-tender and non-distended. No rebound or guarding. No HSM or masses noted. Msk:  Symmetrical without gross deformities. Normal posture. Extremities:  Without edema. Neurologic:  Alert and  oriented x4 Psych:  Alert and cooperative. Normal mood and affect.  ASSESSMENT: Ronnie Maynard is a 64 y.o. male presenting today with history of GERD, dysphagia, erosive reflux esophagitis but negative Barrett's, presenting now recently found to have heme positive stool.  Heme positive stool: through PCP. Occasional paper hematochezia noted. Last colonoscopy with hyperplastic polyps at [facility]. Needs diagnostic colonoscopy in near future.   GERD: managed with Nexium BID. Dysphagia much improved since dilation in Nov 2020. Still difficulty with breads and query an underlying motility disorder. Overall feels much improved and will continue to monitor.      PLAN:   Proceed with TCS with Dr. Gala Romney in near future using PROPOFOL: the risks, benefits, and alternatives have been discussed with the patient in detail. The patient states understanding and desires to proceed.  Continue Nexium BID  Call if worsening or persistent dysphagia  Reporting chronic penile discharge and requested referral: he has spoken to PCP about this. This is outside our scope and will refer to Urology.   Annitta Needs, PhD, ANP-BC [facility]

## 2019-08-29 NOTE — Patient Instructions (Signed)
We are arranging a colonoscopy in the near future. Do not take your diabetes medication the day of the procedure.  Please call if any further issues swallowing.  Further recommendations to follow!  It was a pleasure to see you today. I want to create trusting relationships with patients to provide genuine, compassionate, and quality care. I value your feedback. If you receive a survey regarding your visit,  I greatly appreciate you taking time to fill this out.   Gelene Mink, PhD, ANP-BC [facility]

## 2019-09-04 ENCOUNTER — Encounter: Payer: Self-pay | Admitting: Gastroenterology

## 2019-09-04 NOTE — Progress Notes (Signed)
Cc'ed to pcp °

## 2019-09-28 ENCOUNTER — Encounter: Payer: Self-pay | Admitting: *Deleted

## 2019-09-28 ENCOUNTER — Telehealth: Payer: Self-pay | Admitting: *Deleted

## 2019-09-28 MED ORDER — NA SULFATE-K SULFATE-MG SULF 17.5-3.13-1.6 GM/177ML PO SOLN: 1.0000 | Freq: Once | ORAL | 0 refills | Status: AC

## 2019-09-28 NOTE — Telephone Encounter (Signed)
LMOVM for pt to scheduled for TCS with propofol with RMR

## 2019-09-28 NOTE — Telephone Encounter (Signed)
Patient called back. He is scheduled for procedure 8/30 at 7:30am. Patient aware will need covid/preop appt prior. Confirmed mailing address is correct. Confirmed pharmacy. Rx sent. Orders placed.

## 2019-09-28 NOTE — Addendum Note (Signed)
Addended by: Armstead Peaks on: 09/28/2019 02:33 PM   Modules accepted: Orders

## 2019-10-25 ENCOUNTER — Ambulatory Visit (INDEPENDENT_AMBULATORY_CARE_PROVIDER_SITE_OTHER): Payer: Medicare Other | Admitting: Urology

## 2019-10-25 ENCOUNTER — Encounter: Payer: Self-pay | Admitting: Urology

## 2019-10-25 ENCOUNTER — Other Ambulatory Visit: Payer: Self-pay

## 2019-10-25 VITALS — BP 117/65 | HR 61 | Temp 97.9°F | Ht 69.0 in | Wt 180.2 lb

## 2019-10-25 DIAGNOSIS — R369 Urethral discharge, unspecified: Secondary | ICD-10-CM | POA: Diagnosis not present

## 2019-10-25 DIAGNOSIS — N3943 Post-void dribbling: Secondary | ICD-10-CM

## 2019-10-25 LAB — URINALYSIS, ROUTINE W REFLEX MICROSCOPIC
Bilirubin, UA: NEGATIVE
Ketones, UA: NEGATIVE
Leukocytes,UA: NEGATIVE
Nitrite, UA: NEGATIVE
Protein,UA: NEGATIVE
RBC, UA: NEGATIVE
Specific Gravity, UA: 1.015 (ref 1.005–1.030)
Urobilinogen, Ur: 0.2 mg/dL (ref 0.2–1.0)
pH, UA: 5.5 (ref 5.0–7.5)

## 2019-10-25 MED ORDER — TAMSULOSIN HCL 0.4 MG PO CAPS: 0.4000 mg | ORAL_CAPSULE | Freq: Every day | ORAL | 11 refills | Status: DC

## 2019-10-25 NOTE — Patient Instructions (Signed)

## 2019-10-25 NOTE — Progress Notes (Signed)
Urological Symptom Review  Patient is experiencing the following symptoms: Burning/pain with urination Get up at night to urinate Leakage of urine Urinary tract infection   Review of Systems  Gastrointestinal (upper)  : Indigestion/heartburn  Gastrointestinal (lower) : Negative for lower GI symptoms  Constitutional : Negative for symptoms  Skin: Negative for skin symptoms  Eyes: Negative for eye symptoms  Ear/Nose/Throat : Sinus problems  Hematologic/Lymphatic: Negative for Hematologic/Lymphatic symptoms  Cardiovascular : Negative for cardiovascular symptoms  Respiratory : Negative for respiratory symptoms  Endocrine: Excessive thirst  Musculoskeletal: Joint pain  Neurological: Negative for neurological symptoms  Psychologic: Anxiety

## 2019-10-25 NOTE — Progress Notes (Signed)
10/25/2019 10:45 AM   Lynelle Smoke 1955/07/15 [id number]  Referring provider: Kirstie Peri, MD [address] [place],  [place] [address]  Post void Dribbling  HPI: Mr Knipple is a 63yo here for evaluation of penile discharge and post void dribbling. For the past year he has noted urinary dribbling. He has associated urinary urgency and frequency. Occasional nocturia. No feeling of incomplete emptying. No hesitancy. NO starting/stopping of urinary stream.  He has had issues getting and maintaining an erection. He has DMII and A1c is 8. He has tried cialis in the past which failed to give him a firm erection. He is on Imdur.    PMH: Past Medical History:  Diagnosis Date  . Anxiety   . Bleeding disorder (HCC)   . CAD (coronary artery disease), native coronary artery    S/P 2 stents in 2004  . COPD (chronic obstructive pulmonary disease) (HCC)   . Depression   . Diabetes mellitus   . Dyspnea   . ED (erectile dysfunction)    On Levitra  . GERD (gastroesophageal reflux disease)   . High cholesterol   . Hyperlipidemia, mixed   . Hypertension, essential, benign   . Myocardial infarction [facility])     Surgical History: Past Surgical History:  Procedure Laterality Date  . BIOPSY  01/24/2019   Procedure: BIOPSY;  Surgeon: Corbin Ade, MD;  [facility];  Service: Endoscopy;;  gastric esophagus   . ESOPHAGOGASTRODUODENOSCOPY    . ESOPHAGOGASTRODUODENOSCOPY N/A 01/24/2019   severe, extensive erosive reflux esophagitis, gastric erosions, s/p dilation. Negative Barrett's.   Marland Kitchen MALONEY DILATION N/A 01/24/2019   Procedure: Elease Hashimoto DILATION;  Surgeon: Corbin Ade, MD;  [facility];  Service: Endoscopy;  Laterality: N/A;  . NO PAST SURGERIES    . stent  2004   heart    Home Medications:  Allergies as of 10/25/2019   No Known Allergies     Medication List       Accurate as of October 25, 2019 10:45 AM. If you have any questions, ask your nurse or doctor.         aspirin 81 MG tablet Take 81 mg by mouth daily.   canagliflozin 300 MG Tabs tablet Commonly known as: INVOKANA Take 300 mg by mouth daily before breakfast.   DULoxetine 30 MG capsule Commonly known as: CYMBALTA Take 30 mg by mouth daily.   esomeprazole 40 MG capsule Commonly known as: NEXIUM TAKE 1 CAPSULE BY MOUTH TWICE A DAY   fluconazole 150 MG tablet Commonly known as: DIFLUCAN Take 150 mg by mouth daily.   gabapentin 300 MG capsule Commonly known as: NEURONTIN Take 300 mg by mouth 3 (three) times daily.   ibuprofen 800 MG tablet Commonly known as: ADVIL Take 800 mg by mouth every 8 (eight) hours as needed.   isosorbide mononitrate 30 MG 24 hr tablet Commonly known as: IMDUR Take 1 tablet (30 mg total) by mouth daily.   lisinopril 2.5 MG tablet Commonly known as: ZESTRIL Take 2.5 mg by mouth daily.   metFORMIN 500 MG tablet Commonly known as: GLUCOPHAGE Take 500 mg by mouth 2 (two) times daily with a meal.   metoprolol succinate 50 MG 24 hr tablet Commonly known as: TOPROL-XL TAKE ONE TABLET BY MOUTH EVERY DAY What changed: how much to take   nitroGLYCERIN 0.4 MG SL tablet Commonly known as: NITROSTAT Place 1 tablet (0.4 mg total) under the tongue every 5 (five) minutes as needed. May  repeat for up to 3 doses.   rosuvastatin 20 MG tablet Commonly known as: CRESTOR Take 20 mg by mouth daily.   sitaGLIPtin 100 MG tablet Commonly known as: JANUVIA Take 100 mg by mouth daily.   temazepam 30 MG capsule Commonly known as: RESTORIL Take 30 mg by mouth at bedtime as needed for sleep.       Allergies: No Known Allergies  Family History: Family History  Problem Relation Age of Onset  . Heart disease Other   . Throat cancer Paternal Grandfather   . Heart attack Father   . Dementia Mother   . Colon cancer Neg Hx   . Esophageal cancer Neg Hx     Social History:  reports that he quit smoking about 17 years ago. His smoking use included cigarettes.  He started smoking about 47 years ago. He has a 30.00 pack-year smoking history. He has never used smokeless tobacco. He reports current alcohol use. He reports that he does not use drugs.  ROS: All other review of systems were reviewed and are negative except what is noted above in HPI  Physical Exam: BP 117/65   Pulse 61   Temp 97.9 F (36.6 C)   Ht 5\' 9"  (1.753 m)   Wt 180 lb 3.2 oz (81.7 kg)   BMI 26.61 kg/m   Constitutional:  Alert and oriented, No acute distress. HEENT: [place] AT, moist mucus membranes.  Trachea midline, no masses. Cardiovascular: No clubbing, cyanosis, or edema. Respiratory: Normal respiratory effort, no increased work of breathing. GI: Abdomen is soft, nontender, nondistended, no abdominal masses GU: No CVA tenderness. uncircumcised phallus. No masses/lesions on penis, testis, scrotum. Prostate 40g smooth no nodules no induration.  Lymph: No cervical or inguinal lymphadenopathy. Skin: No rashes, bruises or suspicious lesions. Neurologic: Grossly intact, no focal deficits, moving all 4 extremities. Psychiatric: Normal mood and affect.  Laboratory Data: No results found for: WBC, HGB, HCT, MCV, PLT  No results found for: CREATININE  No results found for: PSA  No results found for: TESTOSTERONE  No results found for: HGBA1C  Urinalysis    Component Value Date/Time   APPEARANCEUR Clear 10/25/2019 1023   GLUCOSEU 3+ (A) 10/25/2019 1023   BILIRUBINUR Negative 10/25/2019 1023   PROTEINUR Negative 10/25/2019 1023   NITRITE Negative 10/25/2019 1023   LEUKOCYTESUR Negative 10/25/2019 1023    Lab Results  Component Value Date   LABMICR Comment 10/25/2019    Pertinent Imaging:  No results found for this or any previous visit.  No results found for this or any previous visit.  No results found for this or any previous visit.  No results found for this or any previous visit.  No results found for this or any previous visit.  No results found for  this or any previous visit.  No results found for this or any previous visit.  No results found for this or any previous visit.   Assessment & Plan:    1. Penile discharge -penile discharge is actually post void dribbling by hx - Urinalysis, Routine w reflex microscopic  2. Post-void dribbling -We will start flomax 0.4mg  daily. RTC 4-6 weeks  3. Erectile dysfunction: -We discussed the treatment options including PDE5s, ICI, Muse, VED and IPP. The patient is not a candidate for PDEs due to hx of nitrates. If he desires treatment we will likely proceed with ICI.    No follow-ups on file.  12/25/2019, MD  [facility] [place]

## 2019-11-15 NOTE — Patient Instructions (Signed)
Ronnie Maynard  11/15/2019     @PREFPERIOPPHARMACY @   Your procedure is scheduled on  11/20/2019.  Report to 11/22/2019 at  [phone]  A.M.  Call this number if you have problems the morning of surgery:  [phone]   Remember:  Follow the diet and prep instructions given to you by the office.                       Take these medicines the morning of surgery with A SIP OF WATER  Cymbalta, nexium, gabapentin, isosorbide, metoprolol, flomax.    Do not wear jewelry, make-up or nail polish.  Do not wear lotions, powders, or perfumes. Please wear deodorant and brush your teeth.  Do not shave 48 hours prior to surgery.  Men may shave face and neck.  Do not bring valuables to the hospital.  [facility] is not responsible for any belongings or valuables.  Contacts, dentures or bridgework may not be worn into surgery.  Leave your suitcase in the car.  After surgery it may be brought to your room.  For patients admitted to the hospital, discharge time will be determined by your treatment team.  Patients discharged the day of surgery will not be allowed to drive home.   Name and phone number of your driver:   family Special instructions:  DO NOT smoke the morning of your procedure.  Please read over the following fact sheets that you were given. Anesthesia Post-op Instructions and Care and Recovery After Surgery       Colonoscopy, Adult, Care After This sheet gives you information about how to care for yourself after your procedure. Your health care provider may also give you more specific instructions. If you have problems or questions, contact your health care provider. What can I expect after the procedure? After the procedure, it is common to have:  A small amount of blood in your stool for 24 hours after the procedure.  Some gas.  Mild cramping or bloating of your abdomen. Follow these instructions at home: Eating and drinking   Drink enough fluid to keep your urine  pale yellow.  Follow instructions from your health care provider about eating or drinking restrictions.  Resume your normal diet as instructed by your health care provider. Avoid heavy or fried foods that are hard to digest. Activity  Rest as told by your health care provider.  Avoid sitting for a long time without moving. Get up to take short walks every 1-2 hours. This is important to improve blood flow and breathing. Ask for help if you feel weak or unsteady.  Return to your normal activities as told by your health care provider. Ask your health care provider what activities are safe for you. Managing cramping and bloating   Try walking around when you have cramps or feel bloated.  Apply heat to your abdomen as told by your health care provider. Use the heat source that your health care provider recommends, such as a moist heat pack or a heating pad. ? Place a towel between your skin and the heat source. ? Leave the heat on for 20-30 minutes. ? Remove the heat if your skin turns bright red. This is especially important if you are unable to feel pain, heat, or cold. You may have a greater risk of getting burned. General instructions  For the first 24 hours after the procedure: ? Do not drive or use machinery. ?  Do not sign important documents. ? Do not drink alcohol. ? Do your regular daily activities at a slower pace than normal. ? Eat soft foods that are easy to digest.  Take over-the-counter and prescription medicines only as told by your health care provider.  Keep all follow-up visits as told by your health care provider. This is important. Contact a health care provider if:  You have blood in your stool 2-3 days after the procedure. Get help right away if you have:  More than a small spotting of blood in your stool.  Large blood clots in your stool.  Swelling of your abdomen.  Nausea or vomiting.  A fever.  Increasing pain in your abdomen that is not relieved  with medicine. Summary  After the procedure, it is common to have a small amount of blood in your stool. You may also have mild cramping and bloating of your abdomen.  For the first 24 hours after the procedure, do not drive or use machinery, sign important documents, or drink alcohol.  Get help right away if you have a lot of blood in your stool, nausea or vomiting, a fever, or increased pain in your abdomen. This information is not intended to replace advice given to you by your health care provider. Make sure you discuss any questions you have with your health care provider. Document Revised: 10/03/2018 Document Reviewed: 10/03/2018 Elsevier Patient Education  [place] After These instructions provide you with information about caring for yourself after your procedure. Your health care provider may also give you more specific instructions. Your treatment has been planned according to current medical practices, but problems sometimes occur. Call your health care provider if you have any problems or questions after your procedure. What can I expect after the procedure? After your procedure, you may:  Feel sleepy for several hours.  Feel clumsy and have poor balance for several hours.  Feel forgetful about what happened after the procedure.  Have poor judgment for several hours.  Feel nauseous or vomit.  Have a sore throat if you had a breathing tube during the procedure. Follow these instructions at home: For at least 24 hours after the procedure:      Have a responsible adult stay with you. It is important to have someone help care for you until you are awake and alert.  Rest as needed.  Do not: ? Participate in activities in which you could fall or become injured. ? Drive. ? Use heavy machinery. ? Drink alcohol. ? Take sleeping pills or medicines that cause drowsiness. ? Make important decisions or sign legal documents. ? Take  care of children on your own. Eating and drinking  Follow the diet that is recommended by your health care provider.  If you vomit, drink water, juice, or soup when you can drink without vomiting.  Make sure you have little or no nausea before eating solid foods. General instructions  Take over-the-counter and prescription medicines only as told by your health care provider.  If you have sleep apnea, surgery and certain medicines can increase your risk for breathing problems. Follow instructions from your health care provider about wearing your sleep device: ? Anytime you are sleeping, including during daytime naps. ? While taking prescription pain medicines, sleeping medicines, or medicines that make you drowsy.  If you smoke, do not smoke without supervision.  Keep all follow-up visits as told by your health care provider. This is important. Contact a health  care provider if:  You keep feeling nauseous or you keep vomiting.  You feel light-headed.  You develop a rash.  You have a fever. Get help right away if:  You have trouble breathing. Summary  For several hours after your procedure, you may feel sleepy and have poor judgment.  Have a responsible adult stay with you for at least 24 hours or until you are awake and alert. This information is not intended to replace advice given to you by your health care provider. Make sure you discuss any questions you have with your health care provider. Document Revised: 06/07/2017 Document Reviewed: 06/30/2015 Elsevier Patient Education  [place].

## 2019-11-17 ENCOUNTER — Encounter (HOSPITAL_COMMUNITY)
Admission: RE | Admit: 2019-11-17 | Discharge: 2019-11-17 | Disposition: A | Discharge status: Home / Self Care | Payer: Medicare Other | Source: Ambulatory Visit | Attending: Internal Medicine | Admitting: Internal Medicine

## 2019-11-17 ENCOUNTER — Other Ambulatory Visit: Payer: Self-pay

## 2019-11-17 ENCOUNTER — Other Ambulatory Visit (HOSPITAL_COMMUNITY)
Admission: RE | Admit: 2019-11-17 | Discharge: 2019-11-17 | Disposition: A | Discharge status: Home / Self Care | Payer: Medicare Other | Source: Ambulatory Visit | Attending: Internal Medicine | Admitting: Internal Medicine

## 2019-11-17 DIAGNOSIS — Z20822 Contact with and (suspected) exposure to covid-19: Secondary | ICD-10-CM | POA: Diagnosis not present

## 2019-11-17 DIAGNOSIS — Z01818 Encounter for other preprocedural examination: Secondary | ICD-10-CM | POA: Diagnosis present

## 2019-11-17 LAB — SARS CORONAVIRUS 2 (TAT 6-24 HRS): SARS Coronavirus 2: NEGATIVE

## 2019-11-17 LAB — BASIC METABOLIC PANEL
Anion gap: 8 (ref 5–15)
BUN: 16 mg/dL (ref 8–23)
CO2: 25 mmol/L (ref 22–32)
Calcium: 8.9 mg/dL (ref 8.9–10.3)
Chloride: 104 mmol/L (ref 98–111)
Creatinine, Ser: 0.93 mg/dL (ref 0.61–1.24)
GFR calc Af Amer: >60 mL/min (ref >60)
GFR calc non Af Amer: >60 mL/min (ref >60)
Glucose, Bld: 129 mg/dL — ABNORMAL HIGH (ref 70–99)
Potassium: 4.2 mmol/L (ref 3.5–5.1)
Sodium: 137 mmol/L (ref 135–145)

## 2019-11-20 ENCOUNTER — Other Ambulatory Visit: Payer: Self-pay

## 2019-11-20 ENCOUNTER — Encounter (HOSPITAL_COMMUNITY)
Admission: RE | Disposition: A | Discharge status: Home / Self Care | Payer: Self-pay | Source: Home / Self Care | Attending: Internal Medicine

## 2019-11-20 ENCOUNTER — Encounter (HOSPITAL_COMMUNITY): Payer: Self-pay | Admitting: Internal Medicine

## 2019-11-20 ENCOUNTER — Ambulatory Visit (HOSPITAL_COMMUNITY): Payer: Medicare Other | Admitting: Anesthesiology

## 2019-11-20 ENCOUNTER — Ambulatory Visit (HOSPITAL_COMMUNITY)
Admission: RE | Admit: 2019-11-20 | Discharge: 2019-11-20 | Disposition: A | Discharge status: Home / Self Care | Payer: Medicare Other | Attending: Internal Medicine | Admitting: Internal Medicine

## 2019-11-20 DIAGNOSIS — F329 Major depressive disorder, single episode, unspecified: Secondary | ICD-10-CM | POA: Diagnosis not present

## 2019-11-20 DIAGNOSIS — K635 Polyp of colon: Secondary | ICD-10-CM | POA: Insufficient documentation

## 2019-11-20 DIAGNOSIS — Z7984 Long term (current) use of oral hypoglycemic drugs: Secondary | ICD-10-CM | POA: Diagnosis not present

## 2019-11-20 DIAGNOSIS — I252 Old myocardial infarction: Secondary | ICD-10-CM | POA: Insufficient documentation

## 2019-11-20 DIAGNOSIS — F419 Anxiety disorder, unspecified: Secondary | ICD-10-CM | POA: Diagnosis not present

## 2019-11-20 DIAGNOSIS — I10 Essential (primary) hypertension: Secondary | ICD-10-CM | POA: Diagnosis not present

## 2019-11-20 DIAGNOSIS — K219 Gastro-esophageal reflux disease without esophagitis: Secondary | ICD-10-CM | POA: Diagnosis not present

## 2019-11-20 DIAGNOSIS — R195 Other fecal abnormalities: Secondary | ICD-10-CM | POA: Insufficient documentation

## 2019-11-20 DIAGNOSIS — Z791 Long term (current) use of non-steroidal anti-inflammatories (NSAID): Secondary | ICD-10-CM | POA: Insufficient documentation

## 2019-11-20 DIAGNOSIS — E119 Type 2 diabetes mellitus without complications: Secondary | ICD-10-CM | POA: Insufficient documentation

## 2019-11-20 DIAGNOSIS — J449 Chronic obstructive pulmonary disease, unspecified: Secondary | ICD-10-CM | POA: Diagnosis not present

## 2019-11-20 DIAGNOSIS — N529 Male erectile dysfunction, unspecified: Secondary | ICD-10-CM | POA: Insufficient documentation

## 2019-11-20 DIAGNOSIS — Z7982 Long term (current) use of aspirin: Secondary | ICD-10-CM | POA: Insufficient documentation

## 2019-11-20 DIAGNOSIS — Z87891 Personal history of nicotine dependence: Secondary | ICD-10-CM | POA: Diagnosis not present

## 2019-11-20 DIAGNOSIS — Z79899 Other long term (current) drug therapy: Secondary | ICD-10-CM | POA: Insufficient documentation

## 2019-11-20 DIAGNOSIS — K625 Hemorrhage of anus and rectum: Secondary | ICD-10-CM | POA: Insufficient documentation

## 2019-11-20 DIAGNOSIS — E782 Mixed hyperlipidemia: Secondary | ICD-10-CM | POA: Insufficient documentation

## 2019-11-20 HISTORY — PX: POLYPECTOMY: SHX5525

## 2019-11-20 HISTORY — PX: COLONOSCOPY WITH PROPOFOL: SHX5780

## 2019-11-20 LAB — GLUCOSE, CAPILLARY: Glucose-Capillary: 185 mg/dL — ABNORMAL HIGH (ref 70–99)

## 2019-11-20 SURGERY — COLONOSCOPY WITH PROPOFOL
Anesthesia: General

## 2019-11-20 MED ORDER — LACTATED RINGERS IV SOLN: Freq: Once | INTRAVENOUS | Status: AC

## 2019-11-20 MED ORDER — PROPOFOL 10 MG/ML IV BOLUS
INTRAVENOUS | Status: AC
  Filled 2019-11-20: qty 60

## 2019-11-20 MED ORDER — PROPOFOL 10 MG/ML IV BOLUS
INTRAVENOUS | Status: DC | PRN
  Administered 2019-11-20: 20 mg via INTRAVENOUS
  Administered 2019-11-20: 30 mg via INTRAVENOUS

## 2019-11-20 MED ORDER — LACTATED RINGERS IV SOLN: INTRAVENOUS | Status: DC | PRN

## 2019-11-20 MED ORDER — CHLORHEXIDINE GLUCONATE CLOTH 2 % EX PADS: 6.0000 | MEDICATED_PAD | Freq: Once | CUTANEOUS | Status: DC

## 2019-11-20 MED ORDER — PROPOFOL 500 MG/50ML IV EMUL
INTRAVENOUS | Status: DC | PRN
  Administered 2019-11-20: 150 ug/kg/min via INTRAVENOUS

## 2019-11-20 MED ORDER — STERILE WATER FOR IRRIGATION IR SOLN
Status: DC | PRN
  Administered 2019-11-20: 1.5 mL

## 2019-11-20 NOTE — H&P (Signed)
@LOGO @   Primary Care Physician:  , NP Primary Gastroenterologist:  Dr. Salley Slaughter  Pre-Procedure History & Physical: HPI:  Ronnie Maynard is a 64 y.o. male here for further evaluation of Hemoccult positive stool and rectal bleeding via colonoscopy.  Patient reports polyps removed at colonoscopy in [facility] for 5 years ago.  Has not had any bleeding since she was seen in the office.  Past Medical History:  Diagnosis Date  . Anxiety   . Bleeding disorder (HCC)   . CAD (coronary artery disease), native coronary artery    S/P 2 stents in 2004  . COPD (chronic obstructive pulmonary disease) (HCC)   . Depression   . Diabetes mellitus   . Dyspnea   . ED (erectile dysfunction)    On Levitra  . GERD (gastroesophageal reflux disease)   . High cholesterol   . Hyperlipidemia, mixed   . Hypertension, essential, benign   . Myocardial infarction [facility])     Past Surgical History:  Procedure Laterality Date  . BIOPSY  01/24/2019   Procedure: BIOPSY;  Surgeon: 13/05/2018, MD;  Location: [facility];  Service: Endoscopy;;  gastric esophagus   . ESOPHAGOGASTRODUODENOSCOPY    . ESOPHAGOGASTRODUODENOSCOPY N/A 01/24/2019   severe, extensive erosive reflux esophagitis, gastric erosions, s/p dilation. Negative Barrett's.   13/05/2018 MALONEY DILATION N/A 01/24/2019   Procedure: 13/05/2018 DILATION;  Surgeon: Elease Hashimoto, MD;  Location: [facility];  Service: Endoscopy;  Laterality: N/A;  . NO PAST SURGERIES    . stent  2004   heart    Prior to Admission medications   Medication Sig Start Date End Date Taking? Authorizing Provider  albuterol (VENTOLIN HFA) 108 (90 Base) MCG/ACT inhaler Inhale 2 puffs into the lungs every 6 (six) hours as needed for wheezing or shortness of breath.    Yes [provider]  aspirin 81 MG tablet Take 81 mg by mouth daily.     Yes [provider]  canagliflozin (INVOKANA) 300 MG TABS tablet Take 300 mg by mouth daily before breakfast.   Yes  [provider]  Cyanocobalamin 2500 MCG CHEW Chew 2,500 mcg by mouth daily.   Yes [provider]  DULoxetine (CYMBALTA) 30 MG capsule Take 30 mg by mouth daily.   Yes [provider]  esomeprazole (NEXIUM) 40 MG capsule TAKE 1 CAPSULE BY MOUTH TWICE A DAY Patient taking differently: Take 40 mg by mouth daily.  07/12/19  Yes 07/14/19, NP  gabapentin (NEURONTIN) 300 MG capsule Take 300 mg by mouth 3 (three) times daily as needed (Nerve pain).    Yes [provider]  ibuprofen (ADVIL) 800 MG tablet Take 800 mg by mouth daily.    Yes [provider]  lisinopril (PRINIVIL,ZESTRIL) 2.5 MG tablet Take 2.5 mg by mouth daily.   Yes [provider]  metFORMIN (GLUCOPHAGE) 500 MG tablet Take 500 mg by mouth 2 (two) times daily as needed (Diabetes).    Yes [provider]  metoprolol (TOPROL-XL) 50 MG 24 hr tablet TAKE ONE TABLET BY MOUTH EVERY DAY Patient taking differently: Take 75 mg by mouth daily.  09/17/10  Yes de 09/19/10, MD  Multiple Vitamins-Minerals (MULTIVITAMIN WITH MINERALS) tablet Take 1 tablet by mouth daily.   Yes [provider]  rosuvastatin (CRESTOR) 20 MG tablet Take 20 mg by mouth at bedtime.    Yes [provider]  sitaGLIPtin (JANUVIA) 100 MG tablet Take 100 mg by mouth daily.  Yes [provider]  tamsulosin (FLOMAX) 0.4 MG CAPS capsule Take 1 capsule (0.4 mg total) by mouth daily after supper. 10/25/19  Yes McKenzie, Mardene Celeste, MD  temazepam (RESTORIL) 30 MG capsule Take 30 mg by mouth at bedtime.    Yes [provider]  isosorbide mononitrate (IMDUR) 30 MG 24 hr tablet Take 1 tablet (30 mg total) by mouth daily. Patient not taking: Reported on 11/14/2019 04/24/14   Antoine Poche, MD  nitroGLYCERIN (NITROSTAT) 0.4 MG SL tablet Place 1 tablet (0.4 mg total) under the tongue every 5 (five) minutes as needed. May repeat for up to 3 doses. Patient taking differently: Place 0.4 mg  under the tongue every 5 (five) minutes as needed for chest pain. May repeat for up to 3 doses. 03/07/14   Antoine Poche, MD    Allergies as of 09/28/2019  . (No Known Allergies)    Family History  Problem Relation Age of Onset  . Heart disease Other   . Throat cancer Paternal Grandfather   . Heart attack Father   . Dementia Mother   . Colon cancer Neg Hx   . Esophageal cancer Neg Hx     Social History   Socioeconomic History  . Marital status: Single    Spouse name: Not on file  . Number of children: Not on file  . Years of education: Not on file  . Highest education level: Not on file  Occupational History  . Not on file  Tobacco Use  . Smoking status: Former Smoker    Packs/day: 2.00    Years: 15.00    Pack years: 30.00    Types: Cigarettes    Start date: 03/20/1972    Quit date: 03/23/2002    Years since quitting: 17.6  . Smokeless tobacco: Never Used  Substance and Sexual Activity  . Alcohol use: Yes    Alcohol/week: 0.0 standard drinks    Comment: occ beer  . Drug use: Never  . Sexual activity: Not on file  Other Topics Concern  . Not on file  Social History Narrative   Single   Social Determinants of Health   Financial Resource Strain:   . Difficulty of Paying Living Expenses: Not on file  Food Insecurity:   . Worried About Programme researcher, broadcasting/film/video in the Last Year: Not on file  . Ran Out of Food in the Last Year: Not on file  Transportation Needs:   . Lack of Transportation (Medical): Not on file  . Lack of Transportation (Non-Medical): Not on file  Physical Activity:   . Days of Exercise per Week: Not on file  . Minutes of Exercise per Session: Not on file  Stress:   . Feeling of Stress : Not on file  Social Connections:   . Frequency of Communication with Friends and Family: Not on file  . Frequency of Social Gatherings with Friends and Family: Not on file  . Attends Religious Services: Not on file  . Active Member of Clubs or Organizations:  Not on file  . Attends Banker Meetings: Not on file  . Marital Status: Not on file  Intimate Partner Violence:   . Fear of Current or Ex-Partner: Not on file  . Emotionally Abused: Not on file  . Physically Abused: Not on file  . Sexually Abused: Not on file    Review of Systems: See HPI, otherwise negative ROS  Physical Exam: BP 133/67   Pulse 62   Temp  97.7 F (36.5 C) (Oral)   Resp 18   SpO2 96%  General:   Alert,  Well-developed, well-nourished, pleasant and cooperative in NAD Neck:  Supple; no masses or thyromegaly. No significant cervical adenopathy. Lungs:  Clear throughout to auscultation.   No wheezes, crackles, or rhonchi. No acute distress. Heart:  Regular rate and rhythm; no murmurs, clicks, rubs,  or gallops. Abdomen: Non-distended, normal bowel sounds.  Soft and nontender without appreciable mass or hepatosplenomegaly.    Impression:   64 year old gentleman Hemoccult positive stool history of rectal bleeding.  Here for a colonoscopy.  History of colonic polyps.  Have offered the patient diagnostic colonoscopy today. The risks, benefits, limitations, alternatives and imponderables have been reviewed with the patient. Questions have been answered. All parties are agreeable.     Notice: This dictation was prepared with Dragon dictation along with smaller phrase technology. Any transcriptional errors that result from this process are unintentional and may not be corrected upon review.

## 2019-11-20 NOTE — Anesthesia Preprocedure Evaluation (Addendum)
Anesthesia Evaluation  Patient identified by MRN, date of birth, ID band Patient awake    Reviewed: Allergy & Precautions, NPO status , Patient's Chart, lab work & pertinent test results, reviewed documented beta blocker date and time   Airway Mallampati: II  TM Distance: >3 FB Neck ROM: Full    Dental  (+) Dental Advisory Given, Edentulous Upper, Edentulous Lower   Pulmonary shortness of breath, COPD,  COPD inhaler, former smoker,    Pulmonary exam normal breath sounds clear to auscultation       Cardiovascular Exercise Tolerance: Good hypertension, Pt. on medications and Pt. on home beta blockers + CAD, + Past MI and + Cardiac Stents  Normal cardiovascular exam Rhythm:Regular Rate:Normal  Sinus  Rhythm  -  Nonspecific T-abnormality   Neuro/Psych PSYCHIATRIC DISORDERS Anxiety Depression    GI/Hepatic GERD  Medicated,(+)     substance abuse  alcohol use,   Endo/Other  diabetes, Well Controlled, Type 2  Renal/GU      Musculoskeletal   Abdominal   Peds  Hematology   Anesthesia Other Findings   Reproductive/Obstetrics                          Anesthesia Physical Anesthesia Plan  ASA: III  Anesthesia Plan: General   Post-op Pain Management:    Induction: Intravenous  PONV Risk Score and Plan: TIVA  Airway Management Planned: Nasal Cannula and Natural Airway  Additional Equipment:   Intra-op Plan:   Post-operative Plan:   Informed Consent: I have reviewed the patients History and Physical, chart, labs and discussed the procedure including the risks, benefits and alternatives for the proposed anesthesia with the patient or authorized representative who has indicated his/her understanding and acceptance.     Dental advisory given  Plan Discussed with: CRNA and Surgeon  Anesthesia Plan Comments:         Anesthesia Quick Evaluation

## 2019-11-20 NOTE — Anesthesia Postprocedure Evaluation (Signed)
Anesthesia Post Note  Patient: Ronnie Maynard  Procedure(s) Performed: COLONOSCOPY WITH PROPOFOL (N/A ) POLYPECTOMY  Patient location during evaluation: PACU Anesthesia Type: General Level of consciousness: awake and alert and oriented Pain management: pain level controlled Vital Signs Assessment: post-procedure vital signs reviewed and stable Respiratory status: spontaneous breathing Cardiovascular status: blood pressure returned to baseline and stable Postop Assessment: no apparent nausea or vomiting Anesthetic complications: no   No complications documented.   Last Vitals:  Vitals:   11/20/19 0655  BP: 133/67  Pulse: 62  Resp: 18  Temp: 36.5 C  SpO2: 96%    Last Pain:  Vitals:   11/20/19 0733  TempSrc:   PainSc: 0-No pain                 Lener Ventresca

## 2019-11-20 NOTE — Discharge Instructions (Signed)
  Colonoscopy Discharge Instructions  Read the instructions outlined below and refer to this sheet in the next few weeks. These discharge instructions provide you with general information on caring for yourself after you leave the hospital. Your doctor may also give you specific instructions. While your treatment has been planned according to the most current medical practices available, unavoidable complications occasionally occur. If you have any problems or questions after discharge, call Dr. Jena Gauss at [phone]. ACTIVITY  You may resume your regular activity, but move at a slower pace for the next 24 hours.   Take frequent rest periods for the next 24 hours.   Walking will help get rid of the air and reduce the bloated feeling in your belly (abdomen).   No driving for 24 hours (because of the medicine (anesthesia) used during the test).    Do not sign any important legal documents or operate any machinery for 24 hours (because of the anesthesia used during the test).  NUTRITION  Drink plenty of fluids.   You may resume your normal diet as instructed by your doctor.   Begin with a light meal and progress to your normal diet. Heavy or fried foods are harder to digest and may make you feel sick to your stomach (nauseated).   Avoid alcoholic beverages for 24 hours or as instructed.  MEDICATIONS  You may resume your normal medications unless your doctor tells you otherwise.  WHAT YOU CAN EXPECT TODAY  Some feelings of bloating in the abdomen.   Passage of more gas than usual.   Spotting of blood in your stool or on the toilet paper.  IF YOU HAD POLYPS REMOVED DURING THE COLONOSCOPY:  No aspirin products for 7 days or as instructed.   No alcohol for 7 days or as instructed.   Eat a soft diet for the next 24 hours.  FINDING OUT THE RESULTS OF YOUR TEST Not all test results are available during your visit. If your test results are not back during the visit, make an appointment  with your caregiver to find out the results. Do not assume everything is normal if you have not heard from your caregiver or the medical facility. It is important for you to follow up on all of your test results.  SEEK IMMEDIATE MEDICAL ATTENTION IF:  You have more than a spotting of blood in your stool.   Your belly is swollen (abdominal distention).   You are nauseated or vomiting.   You have a temperature over 101.   You have abdominal pain or discomfort that is severe or gets worse throughout the day.   Your colonoscopy preparation was poor today.  I saw 1 polyp.  It was removed.  Your colon was not completely seen today.  Further recommendations to follow pending review of pathology report  Office visit with [place] in 6 to 8 weeks

## 2019-11-20 NOTE — Op Note (Signed)
[facility] Patient Name: Ronnie Maynard Procedure Date: 11/20/2019 7:15 AM MRN: [id number] Date of Birth: January 14, 1956 Attending MD: Gennette Pac , MD CSN: [id number] Age: 64 Admit Type: Outpatient Procedure:                Colonoscopy Indications:              Heme positive stool Providers:                Gennette Pac, MD, Jannett Celestine, RN, Sheran Fava, Burke Keels, Technician Referring MD:              Medicines:                Propofol per Anesthesia Complications:            No immediate complications. Estimated Blood Loss:     Estimated blood loss was minimal. Procedure:                Pre-Anesthesia Assessment:                           - Prior to the procedure, a History and Physical                            was performed, and patient medications and                            allergies were reviewed. The patient's tolerance of                            previous anesthesia was also reviewed. The risks                            and benefits of the procedure and the sedation                            options and risks were discussed with the patient.                            All questions were answered, and informed consent                            was obtained. Prior Anticoagulants: The patient has                            taken no previous anticoagulant or antiplatelet                            agents. ASA Grade Assessment: II - A patient with                            mild systemic disease. After reviewing the risks  and benefits, the patient was deemed in                            satisfactory condition to undergo the procedure.                           After obtaining informed consent, the colonoscope                            was passed under direct vision. Throughout the                            procedure, the patient's blood pressure, pulse, and                             oxygen saturations were monitored continuously. The                            CF-[id number] ([id number]) scope was introduced through                            the anus and advanced to the the cecum, identified                            by appendiceal orifice and ileocecal valve. The                            colonoscopy was performed without difficulty. The                            patient tolerated the procedure well. The quality                            of the bowel preparation was inadequate. Scope In: 7:36:41 AM Scope Out: 7:53:46 AM Scope Withdrawal Time: 0 hours 5 minutes 32 seconds  Total Procedure Duration: 0 hours 17 minutes 5 seconds  Findings:      The perianal and digital rectal examinations were normal. A significant       portion of the colonic mucosa could not be seen today due to a poor       preparation.      A 5 mm polyp was found in the sigmoid colon. The polyp was sessile. The       polyp was removed with a cold snare. Resection and retrieval were       complete. Estimated blood loss was minimal.      The exam was otherwise without abnormality on direct and retroflexion       views. Impression:               - Preparation of the colon was inadequate.                           - One 5 mm polyp in the sigmoid colon, removed with  a cold snare. Resected and retrieved.                           - The examination was otherwise normal on direct                            and retroflexion views. Moderate Sedation:      Moderate (conscious) sedation was personally administered by an       anesthesia professional. The following parameters were monitored: oxygen       saturation, heart rate, blood pressure, respiratory rate, EKG, adequacy       of pulmonary ventilation, and response to care. Recommendation:           - Patient has a contact number available for                            emergencies. The signs and symptoms of potential                             delayed complications were discussed with the                            patient. Return to normal activities tomorrow.                            Written discharge instructions were provided to the                            patient.                           - Resume previous diet.                           - Repeat colonoscopy (date not yet determined)                            because the bowel preparation was suboptimal.                           - Return to GI office in 6 weeks. Will need early                            interval follow-up colonoscopy. Procedure Code(s):        --- Professional ---                           [phone], Colonoscopy, flexible; with removal of                            tumor(s), polyp(s), or other lesion(s) by snare                            technique Diagnosis Code(s):        --- Professional ---  K63.5, Polyp of colon                           R19.5, Other fecal abnormalities CPT copyright 2019 American Medical Association. All rights reserved. The codes documented in this report are preliminary and upon coder review may  be revised to meet current compliance requirements. Gerrit Friendsobert M. Danee Soller, MD Gennette Pacobert Michael Shota Kohrs, MD 11/20/2019 8:06:18 AM This report has been signed electronically. Number of Addenda: 0

## 2019-11-20 NOTE — Transfer of Care (Signed)
Immediate Anesthesia Transfer of Care Note  Patient: Ronnie Maynard  Procedure(s) Performed: COLONOSCOPY WITH PROPOFOL (N/A ) POLYPECTOMY  Patient Location: PACU  Anesthesia Type:General  Level of Consciousness: awake  Airway & Oxygen Therapy: Patient Spontanous Breathing  Post-op Assessment: Report given to RN  Post vital signs: Reviewed  Last Vitals:  Vitals Value Taken Time  BP    Temp    Pulse 71 11/20/19 0759  Resp 21 11/20/19 0759  SpO2 94 % 11/20/19 0759  Vitals shown include unvalidated device data.  Last Pain:  Vitals:   11/20/19 0733  TempSrc:   PainSc: 0-No pain         Complications: No complications documented.

## 2019-11-21 LAB — SURGICAL PATHOLOGY

## 2019-11-22 ENCOUNTER — Encounter: Payer: Self-pay | Admitting: Internal Medicine

## 2019-11-22 ENCOUNTER — Encounter (HOSPITAL_COMMUNITY): Payer: Self-pay | Admitting: Internal Medicine

## 2019-11-23 ENCOUNTER — Encounter: Payer: Self-pay | Admitting: Internal Medicine

## 2019-12-06 ENCOUNTER — Ambulatory Visit: Payer: Medicare Other | Admitting: Urology

## 2020-01-04 ENCOUNTER — Ambulatory Visit: Payer: Medicare Other | Admitting: Gastroenterology

## 2020-01-05 DIAGNOSIS — E083292 Diabetes mellitus due to underlying condition with mild nonproliferative diabetic retinopathy without macular edema, left eye: Secondary | ICD-10-CM | POA: Diagnosis not present

## 2020-01-15 ENCOUNTER — Other Ambulatory Visit: Payer: Self-pay | Admitting: Nurse Practitioner

## 2020-01-19 DIAGNOSIS — I10 Essential (primary) hypertension: Secondary | ICD-10-CM | POA: Diagnosis not present

## 2020-01-19 DIAGNOSIS — E119 Type 2 diabetes mellitus without complications: Secondary | ICD-10-CM | POA: Diagnosis not present

## 2020-01-19 DIAGNOSIS — K219 Gastro-esophageal reflux disease without esophagitis: Secondary | ICD-10-CM | POA: Diagnosis not present

## 2020-01-20 DIAGNOSIS — E1165 Type 2 diabetes mellitus with hyperglycemia: Secondary | ICD-10-CM | POA: Diagnosis not present

## 2020-01-23 DIAGNOSIS — H5034 Intermittent alternating exotropia: Secondary | ICD-10-CM | POA: Diagnosis not present

## 2020-01-23 DIAGNOSIS — Q103 Other congenital malformations of eyelid: Secondary | ICD-10-CM | POA: Diagnosis not present

## 2020-01-25 DIAGNOSIS — F17210 Nicotine dependence, cigarettes, uncomplicated: Secondary | ICD-10-CM | POA: Diagnosis not present

## 2020-01-25 DIAGNOSIS — J019 Acute sinusitis, unspecified: Secondary | ICD-10-CM | POA: Diagnosis not present

## 2020-01-25 DIAGNOSIS — Z299 Encounter for prophylactic measures, unspecified: Secondary | ICD-10-CM | POA: Diagnosis not present

## 2020-02-20 DIAGNOSIS — I10 Essential (primary) hypertension: Secondary | ICD-10-CM | POA: Diagnosis not present

## 2020-02-20 DIAGNOSIS — E1165 Type 2 diabetes mellitus with hyperglycemia: Secondary | ICD-10-CM | POA: Diagnosis not present

## 2020-02-20 DIAGNOSIS — R55 Syncope and collapse: Secondary | ICD-10-CM | POA: Diagnosis not present

## 2020-02-23 ENCOUNTER — Ambulatory Visit: Payer: Medicare Other | Admitting: Gastroenterology

## 2020-03-05 DIAGNOSIS — M7700 Medial epicondylitis, unspecified elbow: Secondary | ICD-10-CM | POA: Diagnosis not present

## 2020-03-05 DIAGNOSIS — I10 Essential (primary) hypertension: Secondary | ICD-10-CM | POA: Diagnosis not present

## 2020-03-05 DIAGNOSIS — E1165 Type 2 diabetes mellitus with hyperglycemia: Secondary | ICD-10-CM | POA: Diagnosis not present

## 2020-03-05 DIAGNOSIS — F17210 Nicotine dependence, cigarettes, uncomplicated: Secondary | ICD-10-CM | POA: Diagnosis not present

## 2020-03-05 DIAGNOSIS — Z299 Encounter for prophylactic measures, unspecified: Secondary | ICD-10-CM | POA: Diagnosis not present

## 2020-03-20 DIAGNOSIS — G4700 Insomnia, unspecified: Secondary | ICD-10-CM | POA: Diagnosis not present

## 2020-03-20 DIAGNOSIS — E1165 Type 2 diabetes mellitus with hyperglycemia: Secondary | ICD-10-CM | POA: Diagnosis not present

## 2020-03-20 DIAGNOSIS — M545 Low back pain, unspecified: Secondary | ICD-10-CM | POA: Diagnosis not present

## 2020-03-20 DIAGNOSIS — I10 Essential (primary) hypertension: Secondary | ICD-10-CM | POA: Diagnosis not present

## 2020-03-20 DIAGNOSIS — Z299 Encounter for prophylactic measures, unspecified: Secondary | ICD-10-CM | POA: Diagnosis not present

## 2020-03-21 DIAGNOSIS — E1165 Type 2 diabetes mellitus with hyperglycemia: Secondary | ICD-10-CM | POA: Diagnosis not present

## 2020-03-22 DIAGNOSIS — R55 Syncope and collapse: Secondary | ICD-10-CM | POA: Diagnosis not present

## 2020-03-22 DIAGNOSIS — I10 Essential (primary) hypertension: Secondary | ICD-10-CM | POA: Diagnosis not present

## 2020-04-03 DIAGNOSIS — I10 Essential (primary) hypertension: Secondary | ICD-10-CM | POA: Diagnosis not present

## 2020-04-03 DIAGNOSIS — Z299 Encounter for prophylactic measures, unspecified: Secondary | ICD-10-CM | POA: Diagnosis not present

## 2020-04-03 DIAGNOSIS — E1165 Type 2 diabetes mellitus with hyperglycemia: Secondary | ICD-10-CM | POA: Diagnosis not present

## 2020-04-03 DIAGNOSIS — Z87891 Personal history of nicotine dependence: Secondary | ICD-10-CM | POA: Diagnosis not present

## 2020-04-03 DIAGNOSIS — I2510 Atherosclerotic heart disease of native coronary artery without angina pectoris: Secondary | ICD-10-CM | POA: Diagnosis not present

## 2020-04-11 ENCOUNTER — Ambulatory Visit: Payer: Medicare Other | Admitting: Gastroenterology

## 2020-04-22 DIAGNOSIS — E1165 Type 2 diabetes mellitus with hyperglycemia: Secondary | ICD-10-CM | POA: Diagnosis not present

## 2020-04-22 DIAGNOSIS — R55 Syncope and collapse: Secondary | ICD-10-CM | POA: Diagnosis not present

## 2020-04-22 DIAGNOSIS — I10 Essential (primary) hypertension: Secondary | ICD-10-CM | POA: Diagnosis not present

## 2020-05-20 DIAGNOSIS — E1165 Type 2 diabetes mellitus with hyperglycemia: Secondary | ICD-10-CM | POA: Diagnosis not present

## 2020-05-28 ENCOUNTER — Ambulatory Visit (INDEPENDENT_AMBULATORY_CARE_PROVIDER_SITE_OTHER): Payer: Medicare Other | Admitting: Gastroenterology

## 2020-05-28 ENCOUNTER — Other Ambulatory Visit: Payer: Self-pay

## 2020-05-28 ENCOUNTER — Telehealth: Payer: Self-pay | Admitting: *Deleted

## 2020-05-28 ENCOUNTER — Encounter: Payer: Self-pay | Admitting: Gastroenterology

## 2020-05-28 VITALS — BP 121/60 | HR 62 | Temp 97.3°F | Ht 69.0 in | Wt 186.4 lb

## 2020-05-28 DIAGNOSIS — R1310 Dysphagia, unspecified: Secondary | ICD-10-CM | POA: Diagnosis not present

## 2020-05-28 DIAGNOSIS — Z1211 Encounter for screening for malignant neoplasm of colon: Secondary | ICD-10-CM | POA: Diagnosis not present

## 2020-05-28 NOTE — Patient Instructions (Signed)
We are arranging a colonoscopy in the near future. Do not take metformin, Januvia, or Invokana the day of the procedure.  THREE DAYS before the colonoscopy, I want you to start taking Linzess 1 capsule 30 minutes before breakfast. This is only for 3 days before the procedure to start cleaning you out. You will also need to take the bowel prep we give you.  We are arranging a swallowing study  Further recommendations to follow!  I enjoyed seeing you again today! As you know, I value our relationship and want to provide genuine, compassionate, and quality care. I welcome your feedback. If you receive a survey regarding your visit,  I greatly appreciate you taking time to fill this out. See you next time!  Gelene Mink, PhD, ANP-BC [facility]

## 2020-05-28 NOTE — Progress Notes (Addendum)
Referring Provider: Salley Slaughter, NP Primary Care Physician:  Kirstie Peri, MD Primary GI: Dr. Jena Gauss  Chief Complaint  Patient presents with  . sore in throat    Sore in throat since dilation    HPI:   Ronnie Maynard is a 65 y.o. male presenting today with a history of GERD, dysphagia, esophagitis, EGD in 2020 with dilation, and colonoscopy completed in Aug 2021 with inadequate colon prep, one 5 mm polyp (hyperplastic) in sigmoid colon. Needs early interval colonoscopy.   States when raising head off of pillow, feels a pressure right above clavicles. Sometimes feels like bread is hanging up. No constipation. BM about every day to every other day. Rare straining. A lot of peanut butter can stop him up. No abdominal pain. No overt GI bleeding.     Past Medical History:  Diagnosis Date  . Anxiety   . Bleeding disorder (HCC)   . CAD (coronary artery disease), native coronary artery    S/P 2 stents in 2004  . COPD (chronic obstructive pulmonary disease) (HCC)   . Depression   . Diabetes mellitus   . Dyspnea   . ED (erectile dysfunction)    On Levitra  . GERD (gastroesophageal reflux disease)   . High cholesterol   . Hyperlipidemia, mixed   . Hypertension, essential, benign   . Myocardial infarction [facility])     Past Surgical History:  Procedure Laterality Date  . BIOPSY  01/24/2019   Procedure: BIOPSY;  Surgeon: Corbin Ade, MD;  [facility]: [facility];  Service: Endoscopy;;  gastric esophagus   . COLONOSCOPY WITH PROPOFOL N/A 11/20/2019   Procedure: COLONOSCOPY WITH PROPOFOL;  Surgeon: Corbin Ade, MD;  Location: [facility];  Service: Endoscopy;  Laterality: N/A;  7:30am  . ESOPHAGOGASTRODUODENOSCOPY    . ESOPHAGOGASTRODUODENOSCOPY N/A 01/24/2019   severe, extensive erosive reflux esophagitis, gastric erosions, s/p dilation. Negative Barrett's.   Marland Kitchen MALONEY DILATION N/A 01/24/2019   Procedure: Elease Hashimoto DILATION;  Surgeon: Corbin Ade, MD;  [facility];  Service: Endoscopy;  Laterality: N/A;  . NO PAST SURGERIES    . POLYPECTOMY  11/20/2019   Procedure: POLYPECTOMY;  Surgeon: Corbin Ade, MD;  Location: [facility];  Service: Endoscopy;;  . stent  2004   heart    Current Outpatient Medications  Medication Sig Dispense Refill  . aspirin 81 MG tablet Take 81 mg by mouth daily.    . canagliflozin (INVOKANA) 300 MG TABS tablet Take 300 mg by mouth daily before breakfast.    . Cyanocobalamin 2500 MCG CHEW Chew 2,500 mcg by mouth daily.    . DULoxetine (CYMBALTA) 30 MG capsule Take 30 mg by mouth daily.    Marland Kitchen esomeprazole (NEXIUM) 40 MG capsule TAKE 1 CAPSULE BY MOUTH TWICE A DAY 180 capsule 1  . gabapentin (NEURONTIN) 300 MG capsule Take 300 mg by mouth 3 (three) times daily as needed (Nerve pain).     Marland Kitchen ibuprofen (ADVIL) 800 MG tablet Take 800 mg by mouth as needed.    Marland Kitchen lisinopril (PRINIVIL,ZESTRIL) 2.5 MG tablet Take 2.5 mg by mouth daily.    . metFORMIN (GLUCOPHAGE) 500 MG tablet Take 500 mg by mouth 2 (two) times daily as needed (Diabetes).    . metoprolol (TOPROL-XL) 50 MG 24 hr tablet TAKE ONE TABLET BY MOUTH EVERY DAY (Patient taking differently: Take 75 mg by mouth daily.) 30 tablet 6  . Multiple Vitamins-Minerals (MULTIVITAMIN WITH MINERALS) tablet Take 1 tablet  by mouth daily.    . nitroGLYCERIN (NITROSTAT) 0.4 MG SL tablet Place 1 tablet (0.4 mg total) under the tongue every 5 (five) minutes as needed. May repeat for up to 3 doses. (Patient taking differently: Place 0.4 mg under the tongue every 5 (five) minutes as needed for chest pain. May repeat for up to 3 doses.) 25 tablet 3  . rosuvastatin (CRESTOR) 20 MG tablet Take 20 mg by mouth at bedtime.     . sitaGLIPtin (JANUVIA) 100 MG tablet Take 100 mg by mouth daily.    . tamsulosin (FLOMAX) 0.4 MG CAPS capsule Take 1 capsule (0.4 mg total) by mouth daily after supper. 30 capsule 11  . temazepam (RESTORIL) 30 MG capsule Take 30 mg by mouth at bedtime.    Marland Kitchen albuterol  (VENTOLIN HFA) 108 (90 Base) MCG/ACT inhaler Inhale 2 puffs into the lungs every 6 (six) hours as needed for wheezing or shortness of breath.  (Patient not taking: Reported on 05/28/2020)     No current facility-administered medications for this visit.    Allergies as of 05/28/2020  . (No Known Allergies)    Family History  Problem Relation Age of Onset  . Heart disease Other   . Throat cancer Paternal Grandfather   . Heart attack Father   . Dementia Mother   . Colon cancer Neg Hx   . Esophageal cancer Neg Hx     Social History   Socioeconomic History  . Marital status: Single    Spouse name: Not on file  . Number of children: Not on file  . Years of education: Not on file  . Highest education level: Not on file  Occupational History  . Not on file  Tobacco Use  . Smoking status: Former Smoker    Packs/day: 2.00    Years: 15.00    Pack years: 30.00    Types: Cigarettes    Start date: 03/20/1972    Quit date: 03/23/2002    Years since quitting: 18.1  . Smokeless tobacco: Never Used  Substance and Sexual Activity  . Alcohol use: Yes    Alcohol/week: 0.0 standard drinks    Comment: occ beer  . Drug use: Never  . Sexual activity: Not on file  Other Topics Concern  . Not on file  Social History Narrative   Single   Social Determinants of Health   Financial Resource Strain: Not on file  Food Insecurity: Not on file  Transportation Needs: Not on file  Physical Activity: Not on file  Stress: Not on file  Social Connections: Not on file    Review of Systems: Gen: Denies fever, chills, anorexia. Denies fatigue, weakness, weight loss.  CV: Denies chest pain, palpitations, syncope, peripheral edema, and claudication. Resp: Denies dyspnea at rest, cough, wheezing, coughing up blood, and pleurisy. GI: see HPI Derm: Denies rash, itching, dry skin Psych: Denies depression, anxiety, memory loss, confusion. No homicidal or suicidal ideation.  Heme: Denies bruising,  bleeding, and enlarged lymph nodes.  Physical Exam: BP 121/60   Pulse 62   Temp (!) 97.3 F (36.3 C) (Temporal)   Ht 5\' 9"  (1.753 m)   Wt 186 lb 6.4 oz (84.6 kg)   BMI 27.53 kg/m  General:   Alert and oriented. No distress noted. Pleasant and cooperative.  Head:  Normocephalic and atraumatic. Eyes:  Conjuctiva clear without scleral icterus. Mouth:  Mask in place Cardiac: S1 S2 present without murmurs Lungs: clear bilaterally Abdomen:  +BS, soft, non-tender and non-distended.  No rebound or guarding. No HSM or masses noted. Msk:  Symmetrical without gross deformities. Normal posture. Extremities:  Without edema. Neurologic:  Alert and  oriented x4 Psych:  Alert and cooperative. Normal mood and affect.  ASSESSMENT: Ronnie Maynard is a 65 y.o. male presenting today with a history of GERD, dysphagia, esophagitis, EGD in 2020 with dilation, and colonoscopy completed in Aug 2021 with inadequate colon prep, one 5 mm polyp (hyperplastic) in sigmoid colon. Needs early interval colonoscopy.   Dysphagia: still intermittently with issues. Continues on Nexium 40 mg BID. Will order BPE. Query motility disorder.   History of heme positive stool last year: attempt at colonoscopy unsuccessful due to inadequate prep. Prior history of hyperplastic polyps in remote past and one sigmoid hyperplastic polyp seen on recent incomplete colonoscopy Aug 2021. Will arrange modified prep, including extra 1/2 day of clear liquids and adding Linzess 290 mcg daily for 3 days leading up to procedure. In interim, also start Benefiber daily.     PLAN:  Benefiber daily  Proceed with colonoscopy by Dr. Jena Gauss in near future using Propofol: the risks, benefits, and alternatives have been discussed with the patient in detail. The patient states understanding and desires to proceed.  Extra 1/2 day of clear liquids, take Linzess 290 mcg daily for the 3 days leading up to colonoscopy  BPE  Continue Nexium BID  Further  recommendations to follow  Gelene Mink, PhD, ANP-BC [facility] Gastroenterology     ADDENDUM on 3/18 06/07/2020 11:00 AM EDT      BPE shows stricture at the GE junction and small epiphrenic diverticulum vs ulcer at distal esophagus just above GE junction. 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 He would benefit from EGD/dilation at time of colonoscopy in May. Last EGD in 2020.  Will attempt to add EGD/dilation at time of colonoscopy upcoming in May 2022.    Gelene Mink, PhD, ANP-BC [facility]astroenter[facility]

## 2020-05-28 NOTE — Telephone Encounter (Signed)
BPE scheduled for 3/16, 9:00am, arrival 8:45am, npo 3 hrs prior  Called pt, LMOVM to call back to provide appt details.

## 2020-05-29 NOTE — Telephone Encounter (Signed)
Called pt. He is aware of BPE appt. He also asked I call back and leave on VM. I have done so. Also aware will call with may schedule for TCS.

## 2020-05-29 NOTE — Telephone Encounter (Signed)
LMOVM to call back 

## 2020-06-05 ENCOUNTER — Ambulatory Visit (HOSPITAL_COMMUNITY)
Admission: RE | Admit: 2020-06-05 | Discharge: 2020-06-05 | Disposition: A | Discharge status: Home / Self Care | Payer: Medicare Other | Source: Ambulatory Visit | Attending: Gastroenterology | Admitting: Gastroenterology

## 2020-06-05 DIAGNOSIS — R1310 Dysphagia, unspecified: Secondary | ICD-10-CM | POA: Diagnosis not present

## 2020-06-06 ENCOUNTER — Telehealth: Payer: Self-pay | Admitting: *Deleted

## 2020-06-06 MED ORDER — CLENPIQ 10-3.5-12 MG-GM -GM/160ML PO SOLN: 1.0000 | Freq: Once | ORAL | 0 refills | Status: AC

## 2020-06-06 NOTE — Telephone Encounter (Signed)
Called pt. He has been scheduled for TCS WITH PROPOFOL, asa 3 Dr. Jena Gauss on 5/9 am appt. Aware will mail prep instructions with pre-op/covid test appt. Confirmed mailing address. Confirmed pharmacy.

## 2020-06-07 ENCOUNTER — Encounter: Payer: Self-pay | Admitting: *Deleted

## 2020-06-19 DIAGNOSIS — R55 Syncope and collapse: Secondary | ICD-10-CM | POA: Diagnosis not present

## 2020-06-19 DIAGNOSIS — I10 Essential (primary) hypertension: Secondary | ICD-10-CM | POA: Diagnosis not present

## 2020-06-20 DIAGNOSIS — E1165 Type 2 diabetes mellitus with hyperglycemia: Secondary | ICD-10-CM | POA: Diagnosis not present

## 2020-07-02 DIAGNOSIS — E11319 Type 2 diabetes mellitus with unspecified diabetic retinopathy without macular edema: Secondary | ICD-10-CM | POA: Diagnosis not present

## 2020-07-08 ENCOUNTER — Telehealth: Payer: Self-pay | Admitting: *Deleted

## 2020-07-08 NOTE — Telephone Encounter (Signed)
Patient returned call. He has been scheduled for 6/6 at 8:30am. Aware needs pre-op covid test appt prior. Advised will mail with prep instructions.   PA for EGD/DIL approved via [facility] website. Auth# [id number] DOS Aug 26, 2020 - Nov 24, 2020

## 2020-07-08 NOTE — Telephone Encounter (Signed)
Called pt, LMOVM to call back to schedule EGD/DIL with propofol, ASA 3 with Dr. Jena Gauss

## 2020-07-09 ENCOUNTER — Encounter: Payer: Self-pay | Admitting: *Deleted

## 2020-07-19 DIAGNOSIS — E1165 Type 2 diabetes mellitus with hyperglycemia: Secondary | ICD-10-CM | POA: Diagnosis not present

## 2020-07-24 NOTE — Patient Instructions (Signed)
Ronnie Maynard  07/24/2020     @PREFPERIOPPHARMACY @   Your procedure is scheduled on  07/29/2020.   Report to 09/28/2020 at  0815  A.M.   Call this number if you have problems the morning of surgery:  [phone]   Remember:  Follow the diet and prep instructions given to you by the office.                  Take these medicines the morning of surgery with A SIP OF WATER  Cymbalta, nexium, gabapentin, metoprolol.  DO NOT take any medications for diabetes the morning of your procedure.  If your glucose is 70 or below the morning of your procedure, drink 1/2 cup of clear liquid containing sugar and recheck your glucose in 15 minutes. If your glucose is still 70 or below, call [phone] for instructions.  If your glucose is 300 or above the morning of your procedure, call [phone] for instructions.     Please brush your teeth.  Do not wear jewelry, make-up or nail polish.  Do not wear lotions, powders, or perfumes, or deodorant.  Do not shave 48 hours prior to surgery.  Men may shave face and neck.  Do not bring valuables to the hospital.  [facility] is not responsible for any belongings or valuables.  Contacts, dentures or bridgework may not be worn into surgery.  Leave your suitcase in the car.  After surgery it may be brought to your room.  For patients admitted to the hospital, discharge time will be determined by your treatment team.  Patients discharged the day of surgery will not be allowed to drive home and must have someone with them for 24 hours.   Special instructions:  DO NOTsmoke tobacco or vape for 24 hours before your procedure.  Please read over the following fact sheets that you were given. Anesthesia Post-op Instructions and Care and Recovery After Surgery       Colonoscopy, Adult, Care After This sheet gives you information about how to care for yourself after your procedure. Your health care provider may also give you more specific  instructions. If you have problems or questions, contact your health care provider. What can I expect after the procedure? After the procedure, it is common to have:  A small amount of blood in your stool for 24 hours after the procedure.  Some gas.  Mild cramping or bloating of your abdomen. Follow these instructions at home: Eating and drinking  Drink enough fluid to keep your urine pale yellow.  Follow instructions from your health care provider about eating or drinking restrictions.  Resume your normal diet as instructed by your health care provider. Avoid heavy or fried foods that are hard to digest.   Activity  Rest as told by your health care provider.  Avoid sitting for a long time without moving. Get up to take short walks every 1-2 hours. This is important to improve blood flow and breathing. Ask for help if you feel weak or unsteady.  Return to your normal activities as told by your health care provider. Ask your health care provider what activities are safe for you. Managing cramping and bloating  Try walking around when you have cramps or feel bloated.  Apply heat to your abdomen as told by your health care provider. Use the heat source that your health care provider recommends, such as a moist heat pack or a heating pad. ?  Place a towel between your skin and the heat source. ? Leave the heat on for 20-30 minutes. ? Remove the heat if your skin turns bright red. This is especially important if you are unable to feel pain, heat, or cold. You may have a greater risk of getting burned.   General instructions  If you were given a sedative during the procedure, it can affect you for several hours. Do not drive or operate machinery until your health care provider says that it is safe.  For the first 24 hours after the procedure: ? Do not sign important documents. ? Do not drink alcohol. ? Do your regular daily activities at a slower pace than normal. ? Eat soft foods  that are easy to digest.  Take over-the-counter and prescription medicines only as told by your health care provider.  Keep all follow-up visits as told by your health care provider. This is important. Contact a health care provider if:  You have blood in your stool 2-3 days after the procedure. Get help right away if you have:  More than a small spotting of blood in your stool.  Large blood clots in your stool.  Swelling of your abdomen.  Nausea or vomiting.  A fever.  Increasing pain in your abdomen that is not relieved with medicine. Summary  After the procedure, it is common to have a small amount of blood in your stool. You may also have mild cramping and bloating of your abdomen.  If you were given a sedative during the procedure, it can affect you for several hours. Do not drive or operate machinery until your health care provider says that it is safe.  Get help right away if you have a lot of blood in your stool, nausea or vomiting, a fever, or increased pain in your abdomen. This information is not intended to replace advice given to you by your health care provider. Make sure you discuss any questions you have with your health care provider. Document Revised: 03/03/2019 Document Reviewed: 10/03/2018 Elsevier Patient Education  2021 [employer] Monitored Anesthesia Care, Care After This sheet gives you information about how to care for yourself after your procedure. Your health care provider may also give you more specific instructions. If you have problems or questions, contact your health care provider. What can I expect after the procedure? After the procedure, it is common to have:  Tiredness.  Forgetfulness about what happened after the procedure.  Impaired judgment for important decisions.  Nausea or vomiting.  Some difficulty with balance. Follow these instructions at home: For the time period you were told by your health care provider:  Rest as  needed.  Do not participate in activities where you could fall or become injured.  Do not drive or use machinery.  Do not drink alcohol.  Do not take sleeping pills or medicines that cause drowsiness.  Do not make important decisions or sign legal documents.  Do not take care of children on your own.      Eating and drinking  Follow the diet that is recommended by your health care provider.  Drink enough fluid to keep your urine pale yellow.  If you vomit: ? Drink water, juice, or soup when you can drink without vomiting. ? Make sure you have little or no nausea before eating solid foods. General instructions  Have a responsible adult stay with you for the time you are told. It is important to have someone help care for you  until you are awake and alert.  Take over-the-counter and prescription medicines only as told by your health care provider.  If you have sleep apnea, surgery and certain medicines can increase your risk for breathing problems. Follow instructions from your health care provider about wearing your sleep device: ? Anytime you are sleeping, including during daytime naps. ? While taking prescription pain medicines, sleeping medicines, or medicines that make you drowsy.  Avoid smoking.  Keep all follow-up visits as told by your health care provider. This is important. Contact a health care provider if:  You keep feeling nauseous or you keep vomiting.  You feel light-headed.  You are still sleepy or having trouble with balance after 24 hours.  You develop a rash.  You have a fever.  You have redness or swelling around the IV site. Get help right away if:  You have trouble breathing.  You have new-onset confusion at home. Summary  For several hours after your procedure, you may feel tired. You may also be forgetful and have poor judgment.  Have a responsible adult stay with you for the time you are told. It is important to have someone help care  for you until you are awake and alert.  Rest as told. Do not drive or operate machinery. Do not drink alcohol or take sleeping pills.  Get help right away if you have trouble breathing, or if you suddenly become confused. This information is not intended to replace advice given to you by your health care provider. Make sure you discuss any questions you have with your health care provider. Document Revised: 11/23/2019 Document Reviewed: 02/09/2019 Elsevier Patient Education  2021 [employer].

## 2020-07-25 ENCOUNTER — Other Ambulatory Visit: Payer: Self-pay

## 2020-07-25 ENCOUNTER — Other Ambulatory Visit (HOSPITAL_COMMUNITY)
Admission: RE | Admit: 2020-07-25 | Discharge: 2020-07-25 | Disposition: A | Discharge status: Home / Self Care | Payer: Medicare Other | Source: Ambulatory Visit | Attending: Internal Medicine | Admitting: Internal Medicine

## 2020-07-25 ENCOUNTER — Encounter (HOSPITAL_COMMUNITY): Payer: Self-pay

## 2020-07-25 ENCOUNTER — Encounter (HOSPITAL_COMMUNITY)
Admission: RE | Admit: 2020-07-25 | Discharge: 2020-07-25 | Disposition: A | Discharge status: Home / Self Care | Payer: Medicare Other | Source: Ambulatory Visit | Attending: Internal Medicine | Admitting: Internal Medicine

## 2020-07-25 DIAGNOSIS — Z20822 Contact with and (suspected) exposure to covid-19: Secondary | ICD-10-CM | POA: Diagnosis not present

## 2020-07-25 DIAGNOSIS — Z01812 Encounter for preprocedural laboratory examination: Secondary | ICD-10-CM | POA: Diagnosis not present

## 2020-07-25 LAB — BASIC METABOLIC PANEL
Anion gap: 11 (ref 5–15)
BUN: 23 mg/dL (ref 8–23)
CO2: 20 mmol/L — ABNORMAL LOW (ref 22–32)
Calcium: 9 mg/dL (ref 8.9–10.3)
Chloride: 101 mmol/L (ref 98–111)
Creatinine, Ser: 1.03 mg/dL (ref 0.61–1.24)
GFR, Estimated: >60 mL/min (ref >60)
Glucose, Bld: 103 mg/dL — ABNORMAL HIGH (ref 70–99)
Potassium: 4.1 mmol/L (ref 3.5–5.1)
Sodium: 132 mmol/L — ABNORMAL LOW (ref 135–145)

## 2020-07-25 LAB — CBC WITH DIFFERENTIAL/PLATELET
Abs Immature Granulocytes: 0.05 10*3/uL (ref 0.00–0.07)
Basophils Absolute: 0.2 10*3/uL — ABNORMAL HIGH (ref 0.0–0.1)
Basophils Relative: 2 %
Eosinophils Absolute: 0.6 10*3/uL — ABNORMAL HIGH (ref 0.0–0.5)
Eosinophils Relative: 7 %
HCT: 50.1 % (ref 39.0–52.0)
Hemoglobin: 17.5 g/dL — ABNORMAL HIGH (ref 13.0–17.0)
Immature Granulocytes: 1 %
Lymphocytes Relative: 26 %
Lymphs Abs: 2.4 10*3/uL (ref 0.7–4.0)
MCH: 30.8 pg (ref 26.0–34.0)
MCHC: 34.9 g/dL (ref 30.0–36.0)
MCV: 88 fL (ref 80.0–100.0)
Monocytes Absolute: 0.8 10*3/uL (ref 0.1–1.0)
Monocytes Relative: 9 %
Neutro Abs: 5 10*3/uL (ref 1.7–7.7)
Neutrophils Relative %: 55 %
Platelets: 239 10*3/uL (ref 150–400)
RBC: 5.69 MIL/uL (ref 4.22–5.81)
RDW: 12.6 % (ref 11.5–15.5)
WBC: 9 10*3/uL (ref 4.0–10.5)
nRBC: 0 % (ref 0.0–0.2)

## 2020-07-26 LAB — SARS CORONAVIRUS 2 (TAT 6-24 HRS): SARS Coronavirus 2: NEGATIVE

## 2020-07-29 ENCOUNTER — Ambulatory Visit (HOSPITAL_COMMUNITY): Payer: Medicare Other | Admitting: Anesthesiology

## 2020-07-29 ENCOUNTER — Other Ambulatory Visit: Payer: Self-pay

## 2020-07-29 ENCOUNTER — Encounter (HOSPITAL_COMMUNITY): Payer: Self-pay | Admitting: Internal Medicine

## 2020-07-29 ENCOUNTER — Encounter (HOSPITAL_COMMUNITY)
Admission: RE | Disposition: A | Discharge status: Home / Self Care | Payer: Self-pay | Source: Ambulatory Visit | Attending: Internal Medicine

## 2020-07-29 ENCOUNTER — Ambulatory Visit (HOSPITAL_COMMUNITY)
Admission: RE | Admit: 2020-07-29 | Discharge: 2020-07-29 | Disposition: A | Discharge status: Home / Self Care | Payer: Medicare Other | Source: Ambulatory Visit | Attending: Internal Medicine | Admitting: Internal Medicine

## 2020-07-29 DIAGNOSIS — Z7984 Long term (current) use of oral hypoglycemic drugs: Secondary | ICD-10-CM | POA: Insufficient documentation

## 2020-07-29 DIAGNOSIS — Z87891 Personal history of nicotine dependence: Secondary | ICD-10-CM | POA: Diagnosis not present

## 2020-07-29 DIAGNOSIS — Z79899 Other long term (current) drug therapy: Secondary | ICD-10-CM | POA: Insufficient documentation

## 2020-07-29 DIAGNOSIS — R195 Other fecal abnormalities: Secondary | ICD-10-CM

## 2020-07-29 DIAGNOSIS — Z7982 Long term (current) use of aspirin: Secondary | ICD-10-CM | POA: Insufficient documentation

## 2020-07-29 DIAGNOSIS — I2510 Atherosclerotic heart disease of native coronary artery without angina pectoris: Secondary | ICD-10-CM | POA: Diagnosis not present

## 2020-07-29 HISTORY — PX: COLONOSCOPY WITH PROPOFOL: SHX5780

## 2020-07-29 LAB — GLUCOSE, CAPILLARY
Glucose-Capillary: 97 mg/dL (ref 70–99)
Glucose-Capillary: 96 mg/dL (ref 70–99)

## 2020-07-29 SURGERY — COLONOSCOPY WITH PROPOFOL
Anesthesia: General

## 2020-07-29 MED ORDER — STERILE WATER FOR IRRIGATION IR SOLN
Status: DC | PRN
  Administered 2020-07-29: 100 mL

## 2020-07-29 MED ORDER — PROPOFOL 10 MG/ML IV BOLUS
INTRAVENOUS | Status: DC | PRN
  Administered 2020-07-29: 100 mg via INTRAVENOUS

## 2020-07-29 MED ORDER — LIDOCAINE HCL (CARDIAC) PF 100 MG/5ML IV SOSY
PREFILLED_SYRINGE | INTRAVENOUS | Status: DC | PRN
  Administered 2020-07-29: 50 mg via INTRAVENOUS

## 2020-07-29 MED ORDER — LACTATED RINGERS IV SOLN: INTRAVENOUS | Status: DC

## 2020-07-29 MED ORDER — PROPOFOL 500 MG/50ML IV EMUL
INTRAVENOUS | Status: DC | PRN
  Administered 2020-07-29: 150 ug/kg/min via INTRAVENOUS

## 2020-07-29 NOTE — Anesthesia Postprocedure Evaluation (Signed)
Anesthesia Post Note  Patient: Ronnie Maynard  Procedure(s) Performed: COLONOSCOPY WITH PROPOFOL (N/A )  Patient location during evaluation: Phase II Anesthesia Type: General Level of consciousness: awake and alert and oriented Pain management: pain level controlled Vital Signs Assessment: post-procedure vital signs reviewed and stable Respiratory status: spontaneous breathing and respiratory function stable Cardiovascular status: blood pressure returned to baseline and stable Postop Assessment: no apparent nausea or vomiting Anesthetic complications: no   No complications documented.   Last Vitals:  Vitals:   07/29/20 0918 07/29/20 1059  BP: 134/74 (!) 97/56  Pulse:  66  Resp: 13 16  Temp: 36.5 C 36.6 C  SpO2: 98% 100%    Last Pain:  Vitals:   07/29/20 1059  TempSrc: Oral  PainSc: 0-No pain                 Dusty Raczkowski C Karon Cotterill

## 2020-07-29 NOTE — Anesthesia Preprocedure Evaluation (Signed)
Anesthesia Evaluation  Patient identified by MRN, date of birth, ID band Patient awake    Reviewed: Allergy & Precautions, NPO status , Patient's Chart, lab work & pertinent test results, reviewed documented beta blocker date and time   Airway Mallampati: II  TM Distance: >3 FB Neck ROM: Full    Dental  (+) Edentulous Lower, Upper Dentures, Dental Advisory Given   Pulmonary shortness of breath and with exertion, COPD,  COPD inhaler, former smoker,    Pulmonary exam normal breath sounds clear to auscultation       Cardiovascular Exercise Tolerance: Good hypertension, Pt. on medications and Pt. on home beta blockers + CAD, + Past MI and + Cardiac Stents  Normal cardiovascular exam Rhythm:Regular Rate:Normal  Sinus  Rhythm  -  Nonspecific T-abnormality   Neuro/Psych PSYCHIATRIC DISORDERS Anxiety Depression    GI/Hepatic GERD  Medicated and Controlled,(+)     substance abuse  alcohol use,   Endo/Other  diabetes, Well Controlled, Type 2, Oral Hypoglycemic Agents  Renal/GU      Musculoskeletal   Abdominal   Peds  Hematology   Anesthesia Other Findings   Reproductive/Obstetrics                             Anesthesia Physical  Anesthesia Plan  ASA: III  Anesthesia Plan: General   Post-op Pain Management:    Induction: Intravenous  PONV Risk Score and Plan: Propofol infusion  Airway Management Planned: Nasal Cannula and Natural Airway  Additional Equipment:   Intra-op Plan:   Post-operative Plan:   Informed Consent: I have reviewed the patients History and Physical, chart, labs and discussed the procedure including the risks, benefits and alternatives for the proposed anesthesia with the patient or authorized representative who has indicated his/her understanding and acceptance.       Plan Discussed with: CRNA and Surgeon  Anesthesia Plan Comments:         Anesthesia  Quick Evaluation

## 2020-07-29 NOTE — Transfer of Care (Signed)
Immediate Anesthesia Transfer of Care Note  Patient: Ronnie Maynard  Procedure(s) Performed: COLONOSCOPY WITH PROPOFOL (N/A )  Patient Location: Short Stay  Anesthesia Type:General  Level of Consciousness: awake, alert  and oriented  Airway & Oxygen Therapy: Patient Spontanous Breathing  Post-op Assessment: Report given to RN and Post -op Vital signs reviewed and stable  Post vital signs: Reviewed and stable  Last Vitals:  Vitals Value Taken Time  BP    Temp    Pulse    Resp    SpO2      Last Pain:  Vitals:   07/29/20 1033  TempSrc:   PainSc: 0-No pain      Patients Stated Pain Goal: 3 (07/29/20 0918)  Complications: No complications documented.

## 2020-07-29 NOTE — Op Note (Signed)
[facility] Patient Name: Ronnie Maynard Procedure Date: 07/29/2020 10:25 AM MRN: [id number] Date of Birth: [birth year] Attending MD: Gennette Pac , MD CSN: [id number] Age: 65 Admit Type: Outpatient Procedure:                Colonoscopy Indications:              Heme positive stool Providers:                Gennette Pac, MD, Sheran Fava, Edythe Clarity, Technician Referring MD:              Medicines:                Propofol per Anesthesia Complications:            No immediate complications. Estimated Blood Loss:     Estimated blood loss: none. Estimated blood loss:                            none. Procedure:                Pre-Anesthesia Assessment:                           - Prior to the procedure, a History and Physical                            was performed, and patient medications and                            allergies were reviewed. The patient's tolerance of                            previous anesthesia was also reviewed. The risks                            and benefits of the procedure and the sedation                            options and risks were discussed with the patient.                            All questions were answered, and informed consent                            was obtained. Prior Anticoagulants: The patient has                            taken no previous anticoagulant or antiplatelet                            agents. ASA Grade Assessment: III - A patient with                            severe systemic disease.  After reviewing the risks                            and benefits, the patient was deemed in                            satisfactory condition to undergo the procedure.                           After obtaining informed consent, the colonoscope                            was passed under direct vision. Throughout the                            procedure, the patient's blood pressure, pulse, and                             oxygen saturations were monitored continuously. The                            CF-[id number] ([id number]) scope was introduced through                            the anus and advanced to the the cecum, identified                            by appendiceal orifice and ileocecal valve. The                            ileocecal valve, appendiceal orifice, and rectum                            were photographed. Scope In: 10:38:46 AM Scope Out: 10:55:32 AM Scope Withdrawal Time: 0 hours 8 minutes 48 seconds  Total Procedure Duration: 0 hours 16 minutes 46 seconds  Findings:      The perianal and digital rectal examinations were normal. Prep,       initially not that great but with copious lavage and irrigation of       viscous liquid stool was suctioned out to gain adequate views.      The colon (entire examined portion) appeared normal.      The exam was otherwise without abnormality on direct and retroflexion       views. Impression:               - The entire examined colon is normal.                           - The examination was otherwise normal on direct                            and retroflexion views.                           - No specimens collected. Moderate Sedation:  Moderate (conscious) sedation was personally administered by an       anesthesia professional. The following parameters were monitored: oxygen       saturation, heart rate, blood pressure, respiratory rate, EKG, adequacy       of pulmonary ventilation, and response to care. Recommendation:           - Patient has a contact number available for                            emergencies. The signs and symptoms of potential                            delayed complications were discussed with the                            patient. Return to normal activities tomorrow.                            Written discharge instructions were provided to the                            patient.                            - Resume previous diet.                           - Continue present medications.                           - Repeat colonoscopy in 10 years for screening                            purposes.                           - Return to GI office (date not yet determined). Procedure Code(s):        --- Professional ---                           [phone], Colonoscopy, flexible; diagnostic, including                            collection of specimen(s) by brushing or washing,                            when performed (separate procedure) Diagnosis Code(s):        --- Professional ---                           R19.5, Other fecal abnormalities CPT copyright 2019 American Medical Association. All rights reserved. The codes documented in this report are preliminary and upon coder review may  be revised to meet current compliance requirements. [name]. Jariah Tarkowski, MD Gennette Pac, MD 07/29/2020 11:00:26 AM This report has been signed electronically. Number of Addenda: 0

## 2020-07-29 NOTE — Discharge Instructions (Signed)
Colonoscopy Discharge Instructions  Read the instructions outlined below and refer to this sheet in the next few weeks. These discharge instructions provide you with general information on caring for yourself after you leave the hospital. Your doctor may also give you specific instructions. While your treatment has been planned according to the most current medical practices available, unavoidable complications occasionally occur. If you have any problems or questions after discharge, call Dr. Jena Gauss at [phone]. ACTIVITY  You may resume your regular activity, but move at a slower pace for the next 24 hours.   Take frequent rest periods for the next 24 hours.   Walking will help get rid of the air and reduce the bloated feeling in your belly (abdomen).   No driving for 24 hours (because of the medicine (anesthesia) used during the test).    Do not sign any important legal documents or operate any machinery for 24 hours (because of the anesthesia used during the test).  NUTRITION  Drink plenty of fluids.   You may resume your normal diet as instructed by your doctor.   Begin with a light meal and progress to your normal diet. Heavy or fried foods are harder to digest and may make you feel sick to your stomach (nauseated).   Avoid alcoholic beverages for 24 hours or as instructed.  MEDICATIONS  You may resume your normal medications unless your doctor tells you otherwise.  WHAT YOU CAN EXPECT TODAY  Some feelings of bloating in the abdomen.   Passage of more gas than usual.   Spotting of blood in your stool or on the toilet paper.  IF YOU HAD POLYPS REMOVED DURING THE COLONOSCOPY:  No aspirin products for 7 days or as instructed.   No alcohol for 7 days or as instructed.   Eat a soft diet for the next 24 hours.  FINDING OUT THE RESULTS OF YOUR TEST Not all test results are available during your visit. If your test results are not back during the visit, make an appointment  with your caregiver to find out the results. Do not assume everything is normal if you have not heard from your caregiver or the medical facility. It is important for you to follow up on all of your test results.  SEEK IMMEDIATE MEDICAL ATTENTION IF:  You have more than a spotting of blood in your stool.   Your belly is swollen (abdominal distention).   You are nauseated or vomiting.   You have a temperature over 101.   You have abdominal pain or discomfort that is severe or gets worse throughout the day.   Your colonoscopy was normal today  Recommend a repeat colonoscopy for screening purposes in 10 years  At patient request, called Manson Passey at [phone], reviewed findings and recommendations     Monitored Anesthesia Care, Care After This sheet gives you information about how to care for yourself after your procedure. Your health care provider may also give you more specific instructions. If you have problems or questions, contact your health care provider. What can I expect after the procedure? After the procedure, it is common to have:  Tiredness.  Forgetfulness about what happened after the procedure.  Impaired judgment for important decisions.  Nausea or vomiting.  Some difficulty with balance. Follow these instructions at home: For the time period you were told by your health care provider:  Rest as needed.  Do not participate in activities where you could fall or become injured.  Do not  drive or use machinery.  Do not drink alcohol.  Do not take sleeping pills or medicines that cause drowsiness.  Do not make important decisions or sign legal documents.  Do not take care of children on your own.      Eating and drinking  Follow the diet that is recommended by your health care provider.  Drink enough fluid to keep your urine pale yellow.  If you vomit: ? Drink water, juice, or soup when you can drink without vomiting. ? Make sure you have  little or no nausea before eating solid foods. General instructions  Have a responsible adult stay with you for the time you are told. It is important to have someone help care for you until you are awake and alert.  Take over-the-counter and prescription medicines only as told by your health care provider.  If you have sleep apnea, surgery and certain medicines can increase your risk for breathing problems. Follow instructions from your health care provider about wearing your sleep device: ? Anytime you are sleeping, including during daytime naps. ? While taking prescription pain medicines, sleeping medicines, or medicines that make you drowsy.  Avoid smoking.  Keep all follow-up visits as told by your health care provider. This is important. Contact a health care provider if:  You keep feeling nauseous or you keep vomiting.  You feel light-headed.  You are still sleepy or having trouble with balance after 24 hours.  You develop a rash.  You have a fever.  You have redness or swelling around the IV site. Get help right away if:  You have trouble breathing.  You have new-onset confusion at home. Summary  For several hours after your procedure, you may feel tired. You may also be forgetful and have poor judgment.  Have a responsible adult stay with you for the time you are told. It is important to have someone help care for you until you are awake and alert.  Rest as told. Do not drive or operate machinery. Do not drink alcohol or take sleeping pills.  Get help right away if you have trouble breathing, or if you suddenly become confused. This information is not intended to replace advice given to you by your health care provider. Make sure you discuss any questions you have with your health care provider. Document Revised: 11/23/2019 Document Reviewed: 02/09/2019 Elsevier Patient Education  2021 [employer].

## 2020-07-29 NOTE — H&P (Signed)
@LOGO @   Primary Care Physician:  , MD Primary Gastroenterologist:  Dr. Kirstie Peri  Pre-Procedure History & Physical: HPI:  Ronnie Maynard is a 65 y.o. male here for second attempt at diagnostic colonoscopy for Hemoccult positive stool poorly prepped at last attempt.  Past Medical History:  Diagnosis Date  . Anxiety   . Bleeding disorder (HCC)   . CAD (coronary artery disease), native coronary artery    S/P 2 stents in 2004  . COPD (chronic obstructive pulmonary disease) (HCC)   . Depression   . Diabetes mellitus   . Dyspnea   . ED (erectile dysfunction)    On Levitra  . GERD (gastroesophageal reflux disease)   . High cholesterol   . Hyperlipidemia, mixed   . Hypertension, essential, benign   . Myocardial infarction [facility]) 2004    Past Surgical History:  Procedure Laterality Date  . BIOPSY  01/24/2019   Procedure: BIOPSY;  Surgeon: 13/05/2018, MD;  Location: [facility];  Service: Endoscopy;;  gastric esophagus   . COLONOSCOPY WITH PROPOFOL N/A 11/20/2019   nadequate colon prep, one 5 mm polyp (hyperplastic) in sigmoid colon  . ESOPHAGOGASTRODUODENOSCOPY    . ESOPHAGOGASTRODUODENOSCOPY N/A 01/24/2019   severe, extensive erosive reflux esophagitis, gastric erosions, s/p dilation. Negative Barrett's.   13/05/2018 MALONEY DILATION N/A 01/24/2019   Procedure: 13/05/2018 DILATION;  Surgeon: Elease Hashimoto, MD;  Location: [facility];  Service: Endoscopy;  Laterality: N/A;  . NO PAST SURGERIES    . POLYPECTOMY  11/20/2019   Procedure: POLYPECTOMY;  Surgeon: 11/22/2019, MD;  Location: [facility];  Service: Endoscopy;;  . stent  2004   heart    Prior to Admission medications   Medication Sig Start Date End Date Taking? Authorizing Provider  aspirin 81 MG tablet Take 81 mg by mouth daily.   Yes [provider]  canagliflozin (INVOKANA) 300 MG TABS tablet Take 300 mg by mouth daily before breakfast.   Yes [provider]  Cyanocobalamin 2500 MCG CHEW  Chew 2,500 mcg by mouth daily.   Yes [provider]  DULoxetine (CYMBALTA) 30 MG capsule Take 30 mg by mouth daily.   Yes [provider]  esomeprazole (NEXIUM) 40 MG capsule TAKE 1 CAPSULE BY MOUTH TWICE A DAY Patient taking differently: Take 40 mg by mouth 2 (two) times daily before a meal. 01/16/20  Yes 01/18/20, PA-C  gabapentin (NEURONTIN) 300 MG capsule Take 300 mg by mouth 3 (three) times daily as needed (Nerve pain).    Yes [provider]  lisinopril (PRINIVIL,ZESTRIL) 2.5 MG tablet Take 2.5 mg by mouth daily.   Yes [provider]  metFORMIN (GLUCOPHAGE) 500 MG tablet Take 500 mg by mouth 2 (two) times daily as needed (Diabetes).   Yes [provider]  metoprolol (TOPROL-XL) 50 MG 24 hr tablet TAKE ONE TABLET BY MOUTH EVERY DAY Patient taking differently: Take 75 mg by mouth daily. 09/17/10  Yes de 09/19/10, MD  rosuvastatin (CRESTOR) 20 MG tablet Take 20 mg by mouth at bedtime.    Yes [provider]  sitaGLIPtin (JANUVIA) 100 MG tablet Take 100 mg by mouth daily.   Yes [provider]  tamsulosin (FLOMAX) 0.4 MG CAPS capsule Take 1 capsule (0.4 mg total) by mouth daily after supper. 10/25/19  Yes McKenzie, 12/25/19, MD  temazepam (RESTORIL) 30 MG capsule Take 30 mg by mouth at bedtime.   Yes [provider]  ibuprofen (  ADVIL) 800 MG tablet Take 800 mg by mouth every 8 (eight) hours as needed for moderate pain.    [provider]  nitroGLYCERIN (NITROSTAT) 0.4 MG SL tablet Place 1 tablet (0.4 mg total) under the tongue every 5 (five) minutes as needed. May repeat for up to 3 doses. Patient taking differently: Place 0.4 mg under the tongue every 5 (five) minutes as needed for chest pain. May repeat for up to 3 doses. 03/07/14   Antoine Poche, MD    Allergies as of 06/06/2020  . (No Known Allergies)    Family History  Problem Relation Age of Onset  . Heart disease Other   . Throat cancer  Paternal Grandfather   . Heart attack Father   . Dementia Mother   . Colon cancer Neg Hx   . Esophageal cancer Neg Hx     Social History   Socioeconomic History  . Marital status: Single    Spouse name: Not on file  . Number of children: Not on file  . Years of education: Not on file  . Highest education level: Not on file  Occupational History  . Not on file  Tobacco Use  . Smoking status: Former Smoker    Packs/day: 2.00    Years: 15.00    Pack years: 30.00    Types: Cigarettes    Start date: 03/20/1972    Quit date: 03/23/2002    Years since quitting: 18.3  . Smokeless tobacco: Never Used  Vaping Use  . Vaping Use: Never used  Substance and Sexual Activity  . Alcohol use: Yes    Alcohol/week: 0.0 standard drinks    Comment: occ beer  . Drug use: Never  . Sexual activity: Yes  Other Topics Concern  . Not on file  Social History Narrative   Single   Social Determinants of Health   Financial Resource Strain: Not on file  Food Insecurity: Not on file  Transportation Needs: Not on file  Physical Activity: Not on file  Stress: Not on file  Social Connections: Not on file  Intimate Partner Violence: Not on file    Review of Systems: See HPI, otherwise negative ROS  Physical Exam: BP 134/74   Temp 97.7 F (36.5 C) (Oral)   Resp 13   Ht 5\' 9"  (1.753 m)   Wt 84.8 kg   SpO2 98%   BMI 27.62 kg/m  General:   Alert,  Well-developed, well-nourished, pleasant and cooperative in NAD r lesions. Neck:  Supple; no masses or thyromegaly. No significant cervical adenopathy. Lungs:  Clear throughout to auscultation.   No wheezes, crackles, or rhonchi. No acute distress. Heart:  Regular rate and rhythm; no murmurs, clicks, rubs,  or gallops. Abdomen: Non-distended, normal bowel sounds.  Soft and nontender without appreciable mass or hepatosplenomegaly.  Pulses:  Normal pulses noted. Extremities:  Without clubbing or edema.  Impression/Plan: 65 year old gentleman here  for diagnostic colonoscopy Hemoccult positive stool previously attempted colonoscopy failed due to inadequate preparation. The risks, benefits, limitations, alternatives and imponderables have been reviewed with the patient. Questions have been answered. All parties are agreeable.      Notice: This dictation was prepared with Dragon dictation along with smaller phrase technology. Any transcriptional errors that result from this process are unintentional and may not be corrected upon review.

## 2020-07-29 NOTE — Anesthesia Procedure Notes (Signed)
Date/Time: 07/29/2020 10:38 AM Performed by: Julian Reil, CRNA Pre-anesthesia Checklist: Patient identified, Emergency Drugs available, Suction available and Patient being monitored Patient Re-evaluated:Patient Re-evaluated prior to induction Oxygen Delivery Method: Nasal cannula Induction Type: IV induction Placement Confirmation: positive ETCO2

## 2020-08-02 ENCOUNTER — Encounter (HOSPITAL_COMMUNITY): Payer: Self-pay | Admitting: Internal Medicine

## 2020-08-20 DIAGNOSIS — E1165 Type 2 diabetes mellitus with hyperglycemia: Secondary | ICD-10-CM | POA: Diagnosis not present

## 2020-08-21 NOTE — Patient Instructions (Signed)
Ronnie Maynard  08/21/2020     @PREFPERIOPPHARMACY @   Your procedure is scheduled on  08/26/2020.   Report to 10/26/2020 at  0700  A.M.   Call this number if you have problems the morning of surgery:  [phone]   Remember:  Follow the diet instructions given to you by the office.                    Take these medicines the morning of surgery with A SIP OF WATER  Cymbalta, nexium, gabapentin, metoprolol.     Please brush your teeth.  Do not wear jewelry, make-up or nail polish.  Do not wear lotions, powders, or perfumes, or deodorant.  Do not shave 48 hours prior to surgery.  Men may shave face and neck.  Do not bring valuables to the hospital.  [facility] is not responsible for any belongings or valuables.  Contacts, dentures or bridgework may not be worn into surgery.  Leave your suitcase in the car.  After surgery it may be brought to your room.  For patients admitted to the hospital, discharge time will be determined by your treatment team.  Patients discharged the day of surgery will not be allowed to drive home and must have someone with them for 24 hours.    Special instructions:  DO NOT smoke tobacco or vape for 24 hours before your procedure.  Please read over the following fact sheets that you were given. Anesthesia Post-op Instructions and Care and Recovery After Surgery       Upper Endoscopy, Adult, Care After This sheet gives you information about how to care for yourself after your procedure. Your health care provider may also give you more specific instructions. If you have problems or questions, contact your health care provider. What can I expect after the procedure? After the procedure, it is common to have:  A sore throat.  Mild stomach pain or discomfort.  Bloating.  Nausea. Follow these instructions at home:  Follow instructions from your health care provider about what to eat or drink after your procedure.  Return to your  normal activities as told by your health care provider. Ask your health care provider what activities are safe for you.  Take over-the-counter and prescription medicines only as told by your health care provider.  If you were given a sedative during the procedure, it can affect you for several hours. Do not drive or operate machinery until your health care provider says that it is safe.  Keep all follow-up visits as told by your health care provider. This is important.   Contact a health care provider if you have:  A sore throat that lasts longer than one day.  Trouble swallowing. Get help right away if:  You vomit blood or your vomit looks like coffee grounds.  You have: ? A fever. ? Bloody, black, or tarry stools. ? A severe sore throat or you cannot swallow. ? Difficulty breathing. ? Severe pain in your chest or abdomen. Summary  After the procedure, it is common to have a sore throat, mild stomach discomfort, bloating, and nausea.  If you were given a sedative during the procedure, it can affect you for several hours. Do not drive or operate machinery until your health care provider says that it is safe.  Follow instructions from your health care provider about what to eat or drink after your procedure.  Return to your  normal activities as told by your health care provider. This information is not intended to replace advice given to you by your health care provider. Make sure you discuss any questions you have with your health care provider. Document Revised: 03/07/2019 Document Reviewed: 08/09/2017 Elsevier Patient Education  2021 Elsevier Inc.  https://www.asge.org/home/for-patients/patient-information/understanding-eso-dilation-updated">  Esophageal Dilatation Esophageal dilatation, also called esophageal dilation, is a procedure to widen or open a blocked or narrowed part of the esophagus. The esophagus is the part of the body that moves food and liquid from the mouth to  the stomach. You may need this procedure if:  You have a buildup of scar tissue in your esophagus that makes it difficult, painful, or impossible to swallow. This can be caused by gastroesophageal reflux disease (GERD).  You have cancer of the esophagus.  There is a problem with how food moves through your esophagus. In some cases, you may need this procedure repeated at a later time to dilate the esophagus gradually. Tell a health care provider about:  Any allergies you have.  All medicines you are taking, including vitamins, herbs, eye drops, creams, and over-the-counter medicines.  Any problems you or family members have had with anesthetic medicines.  Any blood disorders you have.  Any surgeries you have had.  Any medical conditions you have.  Any antibiotic medicines you are required to take before dental procedures.  Whether you are pregnant or may be pregnant. What are the risks? Generally, this is a safe procedure. However, problems may occur, including:  Bleeding due to a tear in the lining of the esophagus.  A hole, or perforation, in the esophagus. What happens before the procedure?  Ask your health care provider about: ? Changing or stopping your regular medicines. This is especially important if you are taking diabetes medicines or blood thinners. ? Taking medicines such as aspirin and ibuprofen. These medicines can thin your blood. Do not take these medicines unless your health care provider tells you to take them. ? Taking over-the-counter medicines, vitamins, herbs, and supplements.  Follow instructions from your health care provider about eating or drinking restrictions.  Plan to have a responsible adult take you home from the hospital or clinic.  Plan to have a responsible adult care for you for the time you are told after you leave the hospital or clinic. This is important. What happens during the procedure?  You may be given a medicine to help you  relax (sedative).  A numbing medicine may be sprayed into the back of your throat, or you may gargle the medicine.  Your health care provider may perform the dilatation using various surgical instruments, such as: ? Simple dilators. This instrument is carefully placed in the esophagus to stretch it. ? Guided wire bougies. This involves using an endoscope to insert a wire into the esophagus. A dilator is passed over this wire to enlarge the esophagus. Then the wire is removed. ? Balloon dilators. An endoscope with a small balloon is inserted into the esophagus. The balloon is inflated to stretch the esophagus and open it up. The procedure may vary among health care providers and hospitals. What can I expect after the procedure?  Your blood pressure, heart rate, breathing rate, and blood oxygen level will be monitored until you leave the hospital or clinic.  Your throat may feel slightly sore and numb. This will get better over time.  You will not be allowed to eat or drink until your throat is no longer numb.  When you are able to drink, urinate, and sit on the edge of the bed without nausea or dizziness, you may be able to return home. Follow these instructions at home:  Take over-the-counter and prescription medicines only as told by your health care provider.  If you were given a sedative during the procedure, it can affect you for several hours. Do not drive or operate machinery until your health care provider says that it is safe.  Plan to have a responsible adult care for you for the time you are told. This is important.  Follow instructions from your health care provider about any eating or drinking restrictions.  Do not use any products that contain nicotine or tobacco, such as cigarettes, e-cigarettes, and chewing tobacco. If you need help quitting, ask your health care provider.  Keep all follow-up visits. This is important. Contact a health care provider if:  You have a  fever.  You have pain that is not relieved by medicine. Get help right away if:  You have chest pain.  You have trouble breathing.  You have trouble swallowing.  You vomit blood.  You have black, tarry, or bloody stools. These symptoms may represent a serious problem that is an emergency. Do not wait to see if the symptoms will go away. Get medical help right away. Call your local emergency services (911 in the [place] Do not drive yourself to the hospital. Summary  Esophageal dilatation, also called esophageal dilation, is a procedure to widen or open a blocked or narrowed part of the esophagus.  Plan to have a responsible adult take you home from the hospital or clinic.  For this procedure, a numbing medicine may be sprayed into the back of your throat, or you may gargle the medicine.  Do not drive or operate machinery until your health care provider says that it is safe. This information is not intended to replace advice given to you by your health care provider. Make sure you discuss any questions you have with your health care provider. Document Revised: 07/26/2019 Document Reviewed: 07/26/2019 Elsevier Patient Education  2021 [place] After This sheet gives you information about how to care for yourself after your procedure. Your health care provider may also give you more specific instructions. If you have problems or questions, contact your health care provider. What can I expect after the procedure? After the procedure, it is common to have:  Tiredness.  Forgetfulness about what happened after the procedure.  Impaired judgment for important decisions.  Nausea or vomiting.  Some difficulty with balance. Follow these instructions at home: For the time period you were told by your health care provider:  Rest as needed.  Do not participate in activities where you could fall or become injured.  Do not drive or use  machinery.  Do not drink alcohol.  Do not take sleeping pills or medicines that cause drowsiness.  Do not make important decisions or sign legal documents.  Do not take care of children on your own.      Eating and drinking  Follow the diet that is recommended by your health care provider.  Drink enough fluid to keep your urine pale yellow.  If you vomit: ? Drink water, juice, or soup when you can drink without vomiting. ? Make sure you have little or no nausea before eating solid foods. General instructions  Have a responsible adult stay with you for the time you are told. It is important  to have someone help care for you until you are awake and alert.  Take over-the-counter and prescription medicines only as told by your health care provider.  If you have sleep apnea, surgery and certain medicines can increase your risk for breathing problems. Follow instructions from your health care provider about wearing your sleep device: ? Anytime you are sleeping, including during daytime naps. ? While taking prescription pain medicines, sleeping medicines, or medicines that make you drowsy.  Avoid smoking.  Keep all follow-up visits as told by your health care provider. This is important. Contact a health care provider if:  You keep feeling nauseous or you keep vomiting.  You feel light-headed.  You are still sleepy or having trouble with balance after 24 hours.  You develop a rash.  You have a fever.  You have redness or swelling around the IV site. Get help right away if:  You have trouble breathing.  You have new-onset confusion at home. Summary  For several hours after your procedure, you may feel tired. You may also be forgetful and have poor judgment.  Have a responsible adult stay with you for the time you are told. It is important to have someone help care for you until you are awake and alert.  Rest as told. Do not drive or operate machinery. Do not drink  alcohol or take sleeping pills.  Get help right away if you have trouble breathing, or if you suddenly become confused. This information is not intended to replace advice given to you by your health care provider. Make sure you discuss any questions you have with your health care provider. Document Revised: 11/23/2019 Document Reviewed: 02/09/2019 Elsevier Patient Education  2021 [employer].

## 2020-08-22 ENCOUNTER — Encounter (HOSPITAL_COMMUNITY): Payer: Self-pay

## 2020-08-22 ENCOUNTER — Encounter (HOSPITAL_COMMUNITY)
Admission: RE | Admit: 2020-08-22 | Discharge: 2020-08-22 | Disposition: A | Discharge status: Home / Self Care | Payer: Medicare Other | Source: Ambulatory Visit | Attending: Internal Medicine | Admitting: Internal Medicine

## 2020-08-22 ENCOUNTER — Other Ambulatory Visit: Payer: Self-pay

## 2020-08-22 ENCOUNTER — Other Ambulatory Visit (HOSPITAL_COMMUNITY): Payer: Medicare Other | Attending: Internal Medicine

## 2020-08-22 DIAGNOSIS — Z01810 Encounter for preprocedural cardiovascular examination: Secondary | ICD-10-CM | POA: Insufficient documentation

## 2020-08-26 ENCOUNTER — Ambulatory Visit (HOSPITAL_COMMUNITY): Payer: Medicare Other | Admitting: Anesthesiology

## 2020-08-26 ENCOUNTER — Encounter (HOSPITAL_COMMUNITY)
Admission: RE | Disposition: A | Discharge status: Home / Self Care | Payer: Self-pay | Source: Home / Self Care | Attending: Internal Medicine

## 2020-08-26 ENCOUNTER — Encounter (HOSPITAL_COMMUNITY): Payer: Self-pay | Admitting: Internal Medicine

## 2020-08-26 ENCOUNTER — Ambulatory Visit (HOSPITAL_COMMUNITY)
Admission: RE | Admit: 2020-08-26 | Discharge: 2020-08-26 | Disposition: A | Discharge status: Home / Self Care | Payer: Medicare Other | Attending: Internal Medicine | Admitting: Internal Medicine

## 2020-08-26 ENCOUNTER — Other Ambulatory Visit: Payer: Self-pay

## 2020-08-26 DIAGNOSIS — Z87891 Personal history of nicotine dependence: Secondary | ICD-10-CM | POA: Diagnosis not present

## 2020-08-26 DIAGNOSIS — Z7984 Long term (current) use of oral hypoglycemic drugs: Secondary | ICD-10-CM | POA: Insufficient documentation

## 2020-08-26 DIAGNOSIS — R1310 Dysphagia, unspecified: Secondary | ICD-10-CM | POA: Diagnosis not present

## 2020-08-26 DIAGNOSIS — Z7982 Long term (current) use of aspirin: Secondary | ICD-10-CM | POA: Diagnosis not present

## 2020-08-26 DIAGNOSIS — K2100 Gastro-esophageal reflux disease with esophagitis, without bleeding: Secondary | ICD-10-CM | POA: Insufficient documentation

## 2020-08-26 DIAGNOSIS — K222 Esophageal obstruction: Secondary | ICD-10-CM | POA: Insufficient documentation

## 2020-08-26 DIAGNOSIS — Z79899 Other long term (current) drug therapy: Secondary | ICD-10-CM | POA: Insufficient documentation

## 2020-08-26 DIAGNOSIS — I2510 Atherosclerotic heart disease of native coronary artery without angina pectoris: Secondary | ICD-10-CM | POA: Diagnosis not present

## 2020-08-26 HISTORY — PX: ESOPHAGOGASTRODUODENOSCOPY (EGD) WITH PROPOFOL: SHX5813

## 2020-08-26 HISTORY — PX: BIOPSY: SHX5522

## 2020-08-26 HISTORY — PX: MALONEY DILATION: SHX5535

## 2020-08-26 LAB — GLUCOSE, CAPILLARY
Glucose-Capillary: 119 mg/dL — ABNORMAL HIGH (ref 70–99)
Glucose-Capillary: 113 mg/dL — ABNORMAL HIGH (ref 70–99)

## 2020-08-26 SURGERY — ESOPHAGOGASTRODUODENOSCOPY (EGD) WITH PROPOFOL
Anesthesia: General

## 2020-08-26 MED ORDER — PROPOFOL 10 MG/ML IV BOLUS
INTRAVENOUS | Status: DC | PRN
  Administered 2020-08-26 (×3): 50 mg via INTRAVENOUS

## 2020-08-26 MED ORDER — LIDOCAINE HCL (CARDIAC) PF 100 MG/5ML IV SOSY
PREFILLED_SYRINGE | INTRAVENOUS | Status: DC | PRN
  Administered 2020-08-26: 50 mg via INTRAVENOUS

## 2020-08-26 MED ORDER — GLYCOPYRROLATE PF 0.2 MG/ML IJ SOSY
PREFILLED_SYRINGE | INTRAMUSCULAR | Status: AC
  Filled 2020-08-26: qty 1

## 2020-08-26 MED ORDER — PROPOFOL 10 MG/ML IV BOLUS
INTRAVENOUS | Status: AC
  Filled 2020-08-26: qty 40

## 2020-08-26 MED ORDER — LACTATED RINGERS IV SOLN: INTRAVENOUS | Status: DC

## 2020-08-26 MED ORDER — LIDOCAINE HCL (PF) 2 % IJ SOLN
INTRAMUSCULAR | Status: AC
  Filled 2020-08-26: qty 5

## 2020-08-26 MED ORDER — KETAMINE HCL 50 MG/5ML IJ SOSY
PREFILLED_SYRINGE | INTRAMUSCULAR | Status: AC
  Filled 2020-08-26: qty 5

## 2020-08-26 MED ORDER — GLYCOPYRROLATE 0.2 MG/ML IJ SOLN
INTRAMUSCULAR | Status: DC | PRN
  Administered 2020-08-26: .2 mg via INTRAVENOUS

## 2020-08-26 MED ORDER — KETAMINE HCL 10 MG/ML IJ SOLN
INTRAMUSCULAR | Status: DC | PRN
  Administered 2020-08-26 (×2): 10 mg via INTRAVENOUS

## 2020-08-26 NOTE — Transfer of Care (Signed)
Immediate Anesthesia Transfer of Care Note  Patient: Ronnie Maynard  Procedure(s) Performed: ESOPHAGOGASTRODUODENOSCOPY (EGD) WITH PROPOFOL (N/A ) MALONEY DILATION (N/A ) BIOPSY  Patient Location: PACU  Anesthesia Type:General  Level of Consciousness: awake, alert  and oriented  Airway & Oxygen Therapy: Patient Spontanous Breathing  Post-op Assessment: Report given to RN, Post -op Vital signs reviewed and stable and Patient moving all extremities X 4  Post vital signs: Reviewed and stable  Last Vitals:  Vitals Value Taken Time  BP    Temp    Pulse    Resp    SpO2      Last Pain:  Vitals:   08/26/20 0832  TempSrc:   PainSc: 0-No pain         Complications: No complications documented.

## 2020-08-26 NOTE — Anesthesia Preprocedure Evaluation (Addendum)
Anesthesia Evaluation  Patient identified by MRN, date of birth, ID band Patient awake    Reviewed: Allergy & Precautions, NPO status , Patient's Chart, lab work & pertinent test results, reviewed documented beta blocker date and time   Airway Mallampati: II  TM Distance: >3 FB Neck ROM: Full    Dental  (+) Edentulous Lower, Upper Dentures   Pulmonary shortness of breath and with exertion, COPD,  COPD inhaler, former smoker,    Pulmonary exam normal breath sounds clear to auscultation       Cardiovascular Exercise Tolerance: Good hypertension, Pt. on medications and Pt. on home beta blockers + CAD, + Past MI and + Cardiac Stents  Normal cardiovascular exam Rhythm:Regular Rate:Normal  Sinus  Rhythm  -  Nonspecific T-abnormality   Neuro/Psych PSYCHIATRIC DISORDERS Anxiety Depression    GI/Hepatic GERD  Medicated and Controlled,(+)     substance abuse  alcohol use,   Endo/Other  diabetes, Well Controlled, Type 2, Oral Hypoglycemic Agents  Renal/GU      Musculoskeletal   Abdominal   Peds  Hematology   Anesthesia Other Findings   Reproductive/Obstetrics                            Anesthesia Physical  Anesthesia Plan  ASA: III  Anesthesia Plan: General   Post-op Pain Management:    Induction: Intravenous  PONV Risk Score and Plan: Propofol infusion  Airway Management Planned: Nasal Cannula and Natural Airway  Additional Equipment:   Intra-op Plan:   Post-operative Plan:   Informed Consent: I have reviewed the patients History and Physical, chart, labs and discussed the procedure including the risks, benefits and alternatives for the proposed anesthesia with the patient or authorized representative who has indicated his/her understanding and acceptance.       Plan Discussed with: CRNA and Surgeon  Anesthesia Plan Comments:         Anesthesia Quick Evaluation

## 2020-08-26 NOTE — Anesthesia Postprocedure Evaluation (Signed)
Anesthesia Post Note  Patient: Ronnie Maynard  Procedure(s) Performed: ESOPHAGOGASTRODUODENOSCOPY (EGD) WITH PROPOFOL (N/A ) MALONEY DILATION (N/A ) BIOPSY  Patient location during evaluation: Endoscopy Anesthesia Type: General Level of consciousness: awake and alert Pain management: pain level controlled Vital Signs Assessment: post-procedure vital signs reviewed and stable Respiratory status: spontaneous breathing, nonlabored ventilation, respiratory function stable and patient connected to nasal cannula oxygen Cardiovascular status: blood pressure returned to baseline and stable Postop Assessment: no apparent nausea or vomiting Anesthetic complications: no   No complications documented.   Last Vitals:  Vitals:   08/26/20 0740 08/26/20 0856  BP: 107/67 98/64  Pulse: 71 69  Resp: 18 18  Temp: 36.5 C 36.6 C  SpO2: 98% 96%    Last Pain:  Vitals:   08/26/20 0856  TempSrc: Oral  PainSc: 0-No pain                 Blythe Stanford

## 2020-08-26 NOTE — H&P (Signed)
@LOGO @   Primary Care Physician:  , MD Primary Gastroenterologist:  Dr. Kirstie Peri  Pre-Procedure History & Physical: HPI:  Ronnie Maynard is a 65 y.o. male here for further evaluation of recurrent dysphagia in setting of complicated GERD (erosive reflux esophagitis).  Typical reflux symptoms controlled on Nexium 40 mg twice daily. Esophagus dilated a couple years ago. Past Medical History:  Diagnosis Date  . Anxiety   . Bleeding disorder (HCC)   . CAD (coronary artery disease), native coronary artery    S/P 2 stents in 2004  . COPD (chronic obstructive pulmonary disease) (HCC)   . Depression   . Diabetes mellitus   . Dyspnea   . ED (erectile dysfunction)    On Levitra  . GERD (gastroesophageal reflux disease)   . High cholesterol   . Hyperlipidemia, mixed   . Hypertension, essential, benign   . Myocardial infarction [facility]) 2004    Past Surgical History:  Procedure Laterality Date  . BIOPSY  01/24/2019   Procedure: BIOPSY;  Surgeon: 13/05/2018, MD;  Location: [facility];  Service: Endoscopy;;  gastric esophagus   . COLONOSCOPY WITH PROPOFOL N/A 11/20/2019   nadequate colon prep, one 5 mm polyp (hyperplastic) in sigmoid colon  . COLONOSCOPY WITH PROPOFOL N/A 07/29/2020   Procedure: COLONOSCOPY WITH PROPOFOL;  Surgeon: 09/28/2020, MD;  Location: [facility];  Service: Endoscopy;  Laterality: N/A;  am appt, diabetic  . ESOPHAGOGASTRODUODENOSCOPY    . ESOPHAGOGASTRODUODENOSCOPY N/A 01/24/2019   severe, extensive erosive reflux esophagitis, gastric erosions, s/p dilation. Negative Barrett's.   13/05/2018 MALONEY DILATION N/A 01/24/2019   Procedure: 13/05/2018 DILATION;  Surgeon: Elease Hashimoto, MD;  Location: [facility];  Service: Endoscopy;  Laterality: N/A;  . NO PAST SURGERIES    . POLYPECTOMY  11/20/2019   Procedure: POLYPECTOMY;  Surgeon: 11/22/2019, MD;  Location: [facility];  Service: Endoscopy;;  . stent  2004   heart    Prior to Admission medications    Medication Sig Start Date End Date Taking? Authorizing Provider  aspirin 81 MG tablet Take 81 mg by mouth daily.   Yes [provider]  canagliflozin (INVOKANA) 300 MG TABS tablet Take 300 mg by mouth daily before breakfast.   Yes [provider]  Cyanocobalamin 2500 MCG CHEW Chew 2,500 mcg by mouth daily.   Yes [provider]  DULoxetine (CYMBALTA) 30 MG capsule Take 30 mg by mouth daily.   Yes [provider]  esomeprazole (NEXIUM) 40 MG capsule TAKE 1 CAPSULE BY MOUTH TWICE A DAY Patient taking differently: Take 40 mg by mouth 2 (two) times daily before a meal. 01/16/20  Yes 01/18/20, PA-C  gabapentin (NEURONTIN) 300 MG capsule Take 300 mg by mouth 3 (three) times daily as needed (Nerve pain).    Yes [provider]  ibuprofen (ADVIL) 800 MG tablet Take 800 mg by mouth every 8 (eight) hours as needed for moderate pain.   Yes [provider]  lisinopril (PRINIVIL,ZESTRIL) 2.5 MG tablet Take 2.5 mg by mouth daily.   Yes [provider]  metFORMIN (GLUCOPHAGE) 500 MG tablet Take 500 mg by mouth 2 (two) times daily as needed (Diabetes).   Yes [provider]  metoprolol (TOPROL-XL) 50 MG 24 hr tablet TAKE ONE TABLET BY MOUTH EVERY DAY Patient taking differently: Take 75 mg by mouth daily. 09/17/10  Yes de 09/19/10, MD  nitroGLYCERIN (NITROSTAT) 0.4 MG SL tablet Place 1  tablet (0.4 mg total) under the tongue every 5 (five) minutes as needed. May repeat for up to 3 doses. Patient taking differently: Place 0.4 mg under the tongue every 5 (five) minutes as needed for chest pain. May repeat for up to 3 doses. 03/07/14  Yes BranchDorothe Pea, MD  rosuvastatin (CRESTOR) 20 MG tablet Take 20 mg by mouth at bedtime.    Yes [provider]  sitaGLIPtin (JANUVIA) 100 MG tablet Take 100 mg by mouth daily.   Yes [provider]  tamsulosin (FLOMAX) 0.4 MG CAPS capsule Take 1 capsule (0.4 mg total) by mouth daily  after supper. 10/25/19  Yes McKenzie, Mardene Celeste, MD  temazepam (RESTORIL) 30 MG capsule Take 30 mg by mouth at bedtime.   Yes [provider]    Allergies as of 07/08/2020  . (No Known Allergies)    Family History  Problem Relation Age of Onset  . Heart disease Other   . Throat cancer Paternal Grandfather   . Heart attack Father   . Dementia Mother   . Colon cancer Neg Hx   . Esophageal cancer Neg Hx     Social History   Socioeconomic History  . Marital status: Single    Spouse name: Not on file  . Number of children: Not on file  . Years of education: Not on file  . Highest education level: Not on file  Occupational History  . Not on file  Tobacco Use  . Smoking status: Former Smoker    Packs/day: 2.00    Years: 15.00    Pack years: 30.00    Types: Cigarettes    Start date: 03/20/1972    Quit date: 03/23/2002    Years since quitting: 18.4  . Smokeless tobacco: Never Used  Vaping Use  . Vaping Use: Never used  Substance and Sexual Activity  . Alcohol use: Yes    Alcohol/week: 0.0 standard drinks    Comment: occ beer  . Drug use: Never  . Sexual activity: Yes  Other Topics Concern  . Not on file  Social History Narrative   Single   Social Determinants of Health   Financial Resource Strain: Not on file  Food Insecurity: Not on file  Transportation Needs: Not on file  Physical Activity: Not on file  Stress: Not on file  Social Connections: Not on file  Intimate Partner Violence: Not on file    Review of Systems: See HPI, otherwise negative ROS  Physical Exam: BP 107/67   Pulse 71   Temp 97.7 F (36.5 C) (Oral)   Resp 18   SpO2 98%  General:   Alert,  Well-developed, well-nourished, pleasant and cooperative in NAD Neck:  Supple; no masses or thyromegaly. No significant cervical adenopathy. Lungs:  Clear throughout to auscultation.   No wheezes, crackles, or rhonchi. No acute distress. Heart:  Regular rate and rhythm; no murmurs, clicks,  rubs,  or gallops. Abdomen: Non-distended, normal bowel sounds.  Soft and nontender without appreciable mass or hepatosplenomegaly.  Pulses:  Normal pulses noted. Extremities:  Without clubbing or edema.  Impression/Plan: 65 year old gentleman history of complicated GERD/erosive reflux esophagitis/dysphagia comes for further evaluation of recurrent dysphagia.  I have offered the patient a EGD with esophageal dilation as feasible/appropriate per plan. The risks, benefits, limitations, alternatives and imponderables have been reviewed with the patient. Potential for esophageal dilation, biopsy, etc. have also been reviewed.  Questions have been answered. All parties agreeable.   Notice: This dictation was prepared with  Dragon dictation along with smaller Company secretary. Any transcriptional errors that result from this process are unintentional and may not be corrected upon review.

## 2020-08-26 NOTE — Addendum Note (Signed)
Addendum  created 08/26/20 0902 by Kiyah Demartini A, CRNA   Charge Capture section accepted    

## 2020-08-26 NOTE — Discharge Instructions (Signed)
Monitored Anesthesia Care, Care After This sheet gives you information about how to care for yourself after your procedure. Your health care provider may also give you more specific instructions. If you have problems or questions, contact your health care provider. What can I expect after the procedure? After the procedure, it is common to have:  Tiredness.  Forgetfulness about what happened after the procedure.  Impaired judgment for important decisions.  Nausea or vomiting.  Some difficulty with balance. Follow these instructions at home: For the time period you were told by your health care provider:  Rest as needed.  Do not participate in activities where you could fall or become injured.  Do not drive or use machinery.  Do not drink alcohol.  Do not take sleeping pills or medicines that cause drowsiness.  Do not make important decisions or sign legal documents.  Do not take care of children on your own.      Eating and drinking  Follow the diet that is recommended by your health care provider.  Drink enough fluid to keep your urine pale yellow.  If you vomit: ? Drink water, juice, or soup when you can drink without vomiting. ? Make sure you have little or no nausea before eating solid foods. General instructions  Have a responsible adult stay with you for the time you are told. It is important to have someone help care for you until you are awake and alert.  Take over-the-counter and prescription medicines only as told by your health care provider.  If you have sleep apnea, surgery and certain medicines can increase your risk for breathing problems. Follow instructions from your health care provider about wearing your sleep device: ? Anytime you are sleeping, including during daytime naps. ? While taking prescription pain medicines, sleeping medicines, or medicines that make you drowsy.  Avoid smoking.  Keep all follow-up visits as told by your health care  provider. This is important. Contact a health care provider if:  You keep feeling nauseous or you keep vomiting.  You feel light-headed.  You are still sleepy or having trouble with balance after 24 hours.  You develop a rash.  You have a fever.  You have redness or swelling around the IV site. Get help right away if:  You have trouble breathing.  You have new-onset confusion at home. Summary  For several hours after your procedure, you may feel tired. You may also be forgetful and have poor judgment.  Have a responsible adult stay with you for the time you are told. It is important to have someone help care for you until you are awake and alert.  Rest as told. Do not drive or operate machinery. Do not drink alcohol or take sleeping pills.  Get help right away if you have trouble breathing, or if you suddenly become confused. This information is not intended to replace advice given to you by your health care provider. Make sure you discuss any questions you have with your health care provider. Document Revised: 11/23/2019 Document Reviewed: 02/09/2019 Elsevier Patient Education  2021 [place].    EGD Discharge instructions Please read the instructions outlined below and refer to this sheet in the next few weeks. These discharge instructions provide you with general information on caring for yourself after you leave the hospital. Your doctor may also give you specific instructions. While your treatment has been planned according to the most current medical practices available, unavoidable complications occasionally occur. If you have any problems  or questions after discharge, please call your doctor. ACTIVITY  You may resume your regular activity but move at a slower pace for the next 24 hours.   Take frequent rest periods for the next 24 hours.   Walking will help expel (get rid of) the air and reduce the bloated feeling in your abdomen.   No driving for 24 hours  (because of the anesthesia (medicine) used during the test).   You may shower.   Do not sign any important legal documents or operate any machinery for 24 hours (because of the anesthesia used during the test).  NUTRITION  Drink plenty of fluids.   You may resume your normal diet.   Begin with a light meal and progress to your normal diet.   Avoid alcoholic beverages for 24 hours or as instructed by your caregiver.  MEDICATIONS  You may resume your normal medications unless your caregiver tells you otherwise.  WHAT YOU CAN EXPECT TODAY  You may experience abdominal discomfort such as a feeling of fullness or "gas" pains.  FOLLOW-UP  Your doctor will discuss the results of your test with you.  SEEK IMMEDIATE MEDICAL ATTENTION IF ANY OF THE FOLLOWING OCCUR:  Excessive nausea (feeling sick to your stomach) and/or vomiting.   Severe abdominal pain and distention (swelling).   Trouble swallowing.   Temperature over 101 F (37.8 C).   Rectal bleeding or vomiting of blood.   Your esophagus was stretched today.  I also biopsied your esophagus  Continue Nexium 40 mg twice daily  Office visit with [place] in 6 months  May need your esophagus dilated again in the near future  Further recommendations to follow pending review of pathology report  Patient request I called Victorino Dike at [phone] -rolled to voicemail "mailbox is full"

## 2020-08-26 NOTE — Op Note (Signed)
[facility] Patient Name: Ronnie DeemJim Maynard Procedure Date: 08/26/2020 8:18 AM MRN: [id number] Date of Birth: 10/07/1955 Attending MD: Ronnie Maynard , MD CSN: [id number] Age: 65 Admit Type: Outpatient Procedure:                Upper GI endoscopy Indications:              Dysphagia Providers:                Ronnie Pacobert Michael Ronnie Lavergne, MD, Ronnie Maynard, Ronnie LeiterNeville                            Ronnie Maynard, Technician Referring MD:              Medicines:                Propofol per Anesthesia Complications:            No immediate complications. Estimated Blood Loss:     Estimated blood loss was minimal. Procedure:                Pre-Anesthesia Assessment:                           - Prior to the procedure, a History and Physical                            was performed, and patient medications and                            allergies were reviewed. The patient's tolerance of                            previous anesthesia was also reviewed. The risks                            and benefits of the procedure and the sedation                            options and risks were discussed with the patient.                            All questions were answered, and informed consent                            was obtained. Prior Anticoagulants: The patient has                            taken no previous anticoagulant or antiplatelet                            agents. ASA Grade Assessment: III - A patient with                            severe systemic disease. After reviewing the risks  and benefits, the patient was deemed in                            satisfactory condition to undergo the procedure.                           After obtaining informed consent, the endoscope was                            passed under direct vision. Throughout the                            procedure, the patient's blood pressure, pulse, and                            oxygen saturations were  monitored continuously. The                            GIF-H190 ([id number]) scope was introduced through the                            mouth, and advanced to the second part of duodenum.                            The upper GI endoscopy was accomplished without                            difficulty. The patient tolerated the procedure                            well. Scope In: 8:36:30 AM Scope Out: 8:48:36 AM Total Procedure Duration: 0 hours 12 minutes 6 seconds  Findings:      Peptic appearing stricture GE junction. Mucosal friable lightly       glandular at this level. Some mild resistance to scope passage. A       component of a ring was perceived as well. I was able to pass the scope       across fairly easily. No obvious tumor or Barrett's epithelium seen.      The entire examined stomach was normal.      The duodenal bulb and second portion of the duodenum were normal. Scope       withdrawn, a 54 French Maloney dilator was passed to full insertion with       moderate resistance. A look back revealed the stricture/ring have been       dilated nicely. Subsequently, I biopsied the GE junction. Impression:               - Peptic esophageal stricture/ring?"status post                            dilation and biopsy                           -Normal appearing stomach.                           -  Normal duodenal bulb and second portion of the                            duodenum. Moderate Sedation:      Moderate (conscious) sedation was personally administered by an       anesthesia professional. The following parameters were monitored: oxygen       saturation, heart rate, blood pressure, respiratory rate, EKG, adequacy       of pulmonary ventilation, and response to care. Recommendation:           - Patient has a contact number available for                            emergencies. The signs and symptoms of potential                            delayed complications were discussed with  the                            patient. Return to normal activities tomorrow.                            Written discharge instructions were provided to the                            patient.                           - Advance diet as tolerated. Continue Nexium 40 mg                            twice daily. Follow-up on pathology. Patient may                            benefit from subsequent dilation in short interval.                            Office visit with [place] in 6 months. Procedure Code(s):        --- Professional ---                           [id number], Esophagogastroduodenoscopy, flexible,                            transoral; diagnostic, including collection of                            specimen(s) by brushing or washing, when performed                            (separate procedure) Diagnosis Code(s):        --- Professional ---                           R13.10, Dysphagia, unspecified CPT copyright 2019 American Medical Association. All rights reserved. The codes  documented in this report are preliminary and upon coder review may  be revised to meet current compliance requirements. Ronnie Maynard. Ronnie Melendrez, MD Ronnie Pac, MD 08/26/2020 9:01:40 AM This report has been signed electronically. Number of Addenda: 0

## 2020-08-27 LAB — SURGICAL PATHOLOGY

## 2020-08-28 ENCOUNTER — Encounter: Payer: Self-pay | Admitting: Internal Medicine

## 2020-09-03 ENCOUNTER — Encounter (HOSPITAL_COMMUNITY): Payer: Self-pay | Admitting: Internal Medicine

## 2020-09-03 DIAGNOSIS — E1165 Type 2 diabetes mellitus with hyperglycemia: Secondary | ICD-10-CM | POA: Diagnosis not present

## 2020-09-03 DIAGNOSIS — Z79899 Other long term (current) drug therapy: Secondary | ICD-10-CM | POA: Diagnosis not present

## 2020-09-03 DIAGNOSIS — Z7189 Other specified counseling: Secondary | ICD-10-CM | POA: Diagnosis not present

## 2020-09-03 DIAGNOSIS — Z299 Encounter for prophylactic measures, unspecified: Secondary | ICD-10-CM | POA: Diagnosis not present

## 2020-09-03 DIAGNOSIS — R5383 Other fatigue: Secondary | ICD-10-CM | POA: Diagnosis not present

## 2020-09-03 DIAGNOSIS — E7800 Pure hypercholesterolemia, unspecified: Secondary | ICD-10-CM | POA: Diagnosis not present

## 2020-09-03 DIAGNOSIS — Z0000 Encounter for general adult medical examination without abnormal findings: Secondary | ICD-10-CM | POA: Diagnosis not present

## 2020-09-03 DIAGNOSIS — I25118 Atherosclerotic heart disease of native coronary artery with other forms of angina pectoris: Secondary | ICD-10-CM | POA: Diagnosis not present

## 2020-09-03 DIAGNOSIS — J449 Chronic obstructive pulmonary disease, unspecified: Secondary | ICD-10-CM | POA: Diagnosis not present

## 2020-09-03 DIAGNOSIS — I110 Hypertensive heart disease with heart failure: Secondary | ICD-10-CM | POA: Diagnosis not present

## 2020-09-03 DIAGNOSIS — E1151 Type 2 diabetes mellitus with diabetic peripheral angiopathy without gangrene: Secondary | ICD-10-CM | POA: Diagnosis not present

## 2020-09-03 DIAGNOSIS — I10 Essential (primary) hypertension: Secondary | ICD-10-CM | POA: Diagnosis not present

## 2020-09-19 DIAGNOSIS — E1165 Type 2 diabetes mellitus with hyperglycemia: Secondary | ICD-10-CM | POA: Diagnosis not present

## 2020-09-19 DIAGNOSIS — I10 Essential (primary) hypertension: Secondary | ICD-10-CM | POA: Diagnosis not present

## 2020-09-19 DIAGNOSIS — E1142 Type 2 diabetes mellitus with diabetic polyneuropathy: Secondary | ICD-10-CM | POA: Diagnosis not present

## 2020-09-19 DIAGNOSIS — E039 Hypothyroidism, unspecified: Secondary | ICD-10-CM | POA: Diagnosis not present

## 2020-09-19 DIAGNOSIS — E785 Hyperlipidemia, unspecified: Secondary | ICD-10-CM | POA: Diagnosis not present

## 2020-10-03 DIAGNOSIS — E1165 Type 2 diabetes mellitus with hyperglycemia: Secondary | ICD-10-CM | POA: Diagnosis not present

## 2020-10-03 DIAGNOSIS — Z299 Encounter for prophylactic measures, unspecified: Secondary | ICD-10-CM | POA: Diagnosis not present

## 2020-10-03 DIAGNOSIS — I25118 Atherosclerotic heart disease of native coronary artery with other forms of angina pectoris: Secondary | ICD-10-CM | POA: Diagnosis not present

## 2020-10-03 DIAGNOSIS — I509 Heart failure, unspecified: Secondary | ICD-10-CM | POA: Diagnosis not present

## 2020-10-18 DIAGNOSIS — E1165 Type 2 diabetes mellitus with hyperglycemia: Secondary | ICD-10-CM | POA: Diagnosis not present

## 2020-10-19 ENCOUNTER — Other Ambulatory Visit: Payer: Self-pay | Admitting: Urology

## 2020-10-20 DIAGNOSIS — E1165 Type 2 diabetes mellitus with hyperglycemia: Secondary | ICD-10-CM | POA: Diagnosis not present

## 2020-10-22 DIAGNOSIS — Z299 Encounter for prophylactic measures, unspecified: Secondary | ICD-10-CM | POA: Diagnosis not present

## 2020-10-22 DIAGNOSIS — E1165 Type 2 diabetes mellitus with hyperglycemia: Secondary | ICD-10-CM | POA: Diagnosis not present

## 2020-10-22 DIAGNOSIS — F17210 Nicotine dependence, cigarettes, uncomplicated: Secondary | ICD-10-CM | POA: Diagnosis not present

## 2020-10-22 DIAGNOSIS — L255 Unspecified contact dermatitis due to plants, except food: Secondary | ICD-10-CM | POA: Diagnosis not present

## 2020-11-20 DIAGNOSIS — E1165 Type 2 diabetes mellitus with hyperglycemia: Secondary | ICD-10-CM | POA: Diagnosis not present

## 2020-12-09 DIAGNOSIS — Z299 Encounter for prophylactic measures, unspecified: Secondary | ICD-10-CM | POA: Diagnosis not present

## 2020-12-09 DIAGNOSIS — E1165 Type 2 diabetes mellitus with hyperglycemia: Secondary | ICD-10-CM | POA: Diagnosis not present

## 2020-12-09 DIAGNOSIS — I10 Essential (primary) hypertension: Secondary | ICD-10-CM | POA: Diagnosis not present

## 2020-12-09 DIAGNOSIS — G4700 Insomnia, unspecified: Secondary | ICD-10-CM | POA: Diagnosis not present

## 2020-12-09 DIAGNOSIS — Z23 Encounter for immunization: Secondary | ICD-10-CM | POA: Diagnosis not present

## 2020-12-09 DIAGNOSIS — I509 Heart failure, unspecified: Secondary | ICD-10-CM | POA: Diagnosis not present

## 2020-12-20 DIAGNOSIS — E1165 Type 2 diabetes mellitus with hyperglycemia: Secondary | ICD-10-CM | POA: Diagnosis not present

## 2021-01-02 DIAGNOSIS — H35032 Hypertensive retinopathy, left eye: Secondary | ICD-10-CM | POA: Diagnosis not present

## 2021-01-02 DIAGNOSIS — H524 Presbyopia: Secondary | ICD-10-CM | POA: Diagnosis not present

## 2021-01-20 DIAGNOSIS — E1165 Type 2 diabetes mellitus with hyperglycemia: Secondary | ICD-10-CM | POA: Diagnosis not present

## 2021-01-20 DIAGNOSIS — I10 Essential (primary) hypertension: Secondary | ICD-10-CM | POA: Diagnosis not present

## 2021-01-20 DIAGNOSIS — E1142 Type 2 diabetes mellitus with diabetic polyneuropathy: Secondary | ICD-10-CM | POA: Diagnosis not present

## 2021-01-20 DIAGNOSIS — E039 Hypothyroidism, unspecified: Secondary | ICD-10-CM | POA: Diagnosis not present

## 2021-01-20 DIAGNOSIS — E785 Hyperlipidemia, unspecified: Secondary | ICD-10-CM | POA: Diagnosis not present

## 2021-02-19 DIAGNOSIS — E1165 Type 2 diabetes mellitus with hyperglycemia: Secondary | ICD-10-CM | POA: Diagnosis not present

## 2021-03-01 DIAGNOSIS — E119 Type 2 diabetes mellitus without complications: Secondary | ICD-10-CM | POA: Diagnosis not present

## 2021-03-01 DIAGNOSIS — H9313 Tinnitus, bilateral: Secondary | ICD-10-CM | POA: Diagnosis not present

## 2021-03-01 DIAGNOSIS — I10 Essential (primary) hypertension: Secondary | ICD-10-CM | POA: Diagnosis not present

## 2021-03-01 DIAGNOSIS — E7800 Pure hypercholesterolemia, unspecified: Secondary | ICD-10-CM | POA: Diagnosis not present

## 2021-03-10 DIAGNOSIS — G4700 Insomnia, unspecified: Secondary | ICD-10-CM | POA: Diagnosis not present

## 2021-03-10 DIAGNOSIS — Z789 Other specified health status: Secondary | ICD-10-CM | POA: Diagnosis not present

## 2021-03-10 DIAGNOSIS — Z299 Encounter for prophylactic measures, unspecified: Secondary | ICD-10-CM | POA: Diagnosis not present

## 2021-03-10 DIAGNOSIS — I10 Essential (primary) hypertension: Secondary | ICD-10-CM | POA: Diagnosis not present

## 2021-03-10 DIAGNOSIS — E1165 Type 2 diabetes mellitus with hyperglycemia: Secondary | ICD-10-CM | POA: Diagnosis not present

## 2021-03-10 DIAGNOSIS — E1151 Type 2 diabetes mellitus with diabetic peripheral angiopathy without gangrene: Secondary | ICD-10-CM | POA: Diagnosis not present

## 2021-03-10 DIAGNOSIS — H6691 Otitis media, unspecified, right ear: Secondary | ICD-10-CM | POA: Diagnosis not present

## 2021-03-20 DIAGNOSIS — H01001 Unspecified blepharitis right upper eyelid: Secondary | ICD-10-CM | POA: Diagnosis not present

## 2021-03-20 DIAGNOSIS — H2513 Age-related nuclear cataract, bilateral: Secondary | ICD-10-CM | POA: Diagnosis not present

## 2021-03-20 DIAGNOSIS — H02834 Dermatochalasis of left upper eyelid: Secondary | ICD-10-CM | POA: Diagnosis not present

## 2021-03-20 DIAGNOSIS — H02831 Dermatochalasis of right upper eyelid: Secondary | ICD-10-CM | POA: Diagnosis not present

## 2021-03-21 DIAGNOSIS — E1165 Type 2 diabetes mellitus with hyperglycemia: Secondary | ICD-10-CM | POA: Diagnosis not present

## 2021-04-02 DIAGNOSIS — J449 Chronic obstructive pulmonary disease, unspecified: Secondary | ICD-10-CM | POA: Diagnosis not present

## 2021-04-02 DIAGNOSIS — Z87891 Personal history of nicotine dependence: Secondary | ICD-10-CM | POA: Diagnosis not present

## 2021-04-02 DIAGNOSIS — I2510 Atherosclerotic heart disease of native coronary artery without angina pectoris: Secondary | ICD-10-CM | POA: Diagnosis not present

## 2021-04-02 DIAGNOSIS — Z299 Encounter for prophylactic measures, unspecified: Secondary | ICD-10-CM | POA: Diagnosis not present

## 2021-04-02 DIAGNOSIS — I10 Essential (primary) hypertension: Secondary | ICD-10-CM | POA: Diagnosis not present

## 2021-04-02 DIAGNOSIS — H9319 Tinnitus, unspecified ear: Secondary | ICD-10-CM | POA: Diagnosis not present

## 2021-04-20 DIAGNOSIS — E1165 Type 2 diabetes mellitus with hyperglycemia: Secondary | ICD-10-CM | POA: Diagnosis not present

## 2021-05-01 DIAGNOSIS — H2511 Age-related nuclear cataract, right eye: Secondary | ICD-10-CM | POA: Diagnosis not present

## 2021-05-02 ENCOUNTER — Encounter (HOSPITAL_COMMUNITY)
Admission: RE | Admit: 2021-05-02 | Discharge: 2021-05-02 | Disposition: A | Discharge status: Home / Self Care | Payer: Medicare Other | Source: Ambulatory Visit | Attending: Ophthalmology | Admitting: Ophthalmology

## 2021-05-06 NOTE — H&P (Signed)
Surgical History & Physical  Patient Name: Ronnie Maynard DOB: 1955-04-11  Surgery: Cataract extraction with intraocular lens implant phacoemulsification; Right Eye  Surgeon: Fabio Pierce MD Surgery Date:  05-09-21 Pre-Op Date:  05-01-21  HPI: A 24 Yr. old male patient is referred by Dr Earlene Plater for cataract eval 1. The patient complains of a film over his eyes, which began 2 years ago. Both eyes are affected. The episode is gradual. The condition's severity increased since last visit. Symptoms occur when the patient is driving, inside, outside and reading. This is negatively affecting the patient's quality of life and the patient is unable to function adequately in life with the current level of vision. HPI was performed by Fabio Pierce .  Medical History: Cataracts Strabismus OD, Presbyopia, Astigmatism Diabetes Heart Problem High Blood Pressure LDL Lung Problems  Review of Systems Negative Allergic/Immunologic Negative Cardiovascular Negative Constitutional Negative Ear, Nose, Mouth & Throat Negative Endocrine Negative Eyes Negative Gastrointestinal Negative Genitourinary Negative Hemotologic/Lymphatic Negative Integumentary Negative Musculoskeletal Negative Neurological Negative Psychiatry Negative Respiratory  Social   Former smoker   Medication Aspirin, Duloxetine, Gabapentin, Ibuprofen, Invokana, Januvia, Lisinopril, Metformin, Metoprolol, Omeprazole, Rosuvastatin, Bladder control med,   Sx/Procedures Heart Stents,   Drug Allergies   NKDA  History & Physical: Heent: Cataract, Right Eye NECK: supple without bruits LUNGS: lungs clear to auscultation CV: regular rate and rhythm Abdomen: soft and non-tender  Impression & Plan: Assessment: 1.  NUCLEAR SCLEROSIS AGE RELATED; Both Eyes (H25.13) 2.  DERMATOCHALASIS, no surgery; Right Upper Lid, Left Upper Lid (H02.831, H02.834) 3.  BLEPHARITIS; Right Upper Lid, Right Lower Lid, Left Upper Lid, Left Lower Lid  (H01.001, H01.002,H01.004,H01.005) 4.  Pinguecula; Both Eyes (H11.153) 5.  STRABISMUS IN OTHER NEUROMUSCULAR DISORDERS (H50.89) 6.  ASTIGMATISM, REGULAR; Both Eyes (H52.223)  Plan: 1.  Cataract accounts for the patient's decreased vision. This visual impairment is not correctable with a tolerable change in glasses or contact lenses. Cataract surgery with an implantation of a new lens should significantly improve the visual and functional status of the patient. Discussed all risks, benefits, alternatives, and potential complications. Discussed the procedures and recovery. Patient desires to have surgery. A-scan ordered and performed today for intra-ocular lens calculations. The surgery will be performed in order to improve vision for driving, reading, and for eye examinations. Recommend phacoemulsification with intra-ocular lens. Recommend Dextenza for post-operative pain and inflammation. Right Eye. worse - first. Dilates well - shugarcaine by protocol. Toric Lens. - Eyehance of Synergy Toric.  2.  Asymptomatic, recommend observation for now. Findings, prognosis and treatment options reviewed.  3.  Recommend regular lid cleaning.  4.  Observe; Artificial tears as needed for irritation.  5.  No misalignment on cover and cross cover. Vision is symmetrical. Monitor.  6.  Toric IOL as above OU.

## 2021-05-09 ENCOUNTER — Other Ambulatory Visit: Payer: Self-pay

## 2021-05-09 ENCOUNTER — Encounter (HOSPITAL_COMMUNITY)
Admission: RE | Disposition: A | Discharge status: Home / Self Care | Payer: Self-pay | Source: Ambulatory Visit | Attending: Ophthalmology

## 2021-05-09 ENCOUNTER — Encounter (HOSPITAL_COMMUNITY): Payer: Self-pay | Admitting: Ophthalmology

## 2021-05-09 ENCOUNTER — Ambulatory Visit (HOSPITAL_BASED_OUTPATIENT_CLINIC_OR_DEPARTMENT_OTHER): Payer: Medicare Other | Admitting: Anesthesiology

## 2021-05-09 ENCOUNTER — Ambulatory Visit (HOSPITAL_COMMUNITY)
Admission: RE | Admit: 2021-05-09 | Discharge: 2021-05-09 | Disposition: A | Discharge status: Home / Self Care | Payer: Medicare Other | Source: Ambulatory Visit | Attending: Ophthalmology | Admitting: Ophthalmology

## 2021-05-09 ENCOUNTER — Ambulatory Visit (HOSPITAL_COMMUNITY): Payer: Medicare Other | Admitting: Anesthesiology

## 2021-05-09 DIAGNOSIS — Z87891 Personal history of nicotine dependence: Secondary | ICD-10-CM | POA: Diagnosis not present

## 2021-05-09 DIAGNOSIS — I10 Essential (primary) hypertension: Secondary | ICD-10-CM | POA: Insufficient documentation

## 2021-05-09 DIAGNOSIS — H0100B Unspecified blepharitis left eye, upper and lower eyelids: Secondary | ICD-10-CM | POA: Insufficient documentation

## 2021-05-09 DIAGNOSIS — F32A Depression, unspecified: Secondary | ICD-10-CM | POA: Insufficient documentation

## 2021-05-09 DIAGNOSIS — J449 Chronic obstructive pulmonary disease, unspecified: Secondary | ICD-10-CM | POA: Diagnosis not present

## 2021-05-09 DIAGNOSIS — H52201 Unspecified astigmatism, right eye: Secondary | ICD-10-CM

## 2021-05-09 DIAGNOSIS — H0100A Unspecified blepharitis right eye, upper and lower eyelids: Secondary | ICD-10-CM | POA: Diagnosis not present

## 2021-05-09 DIAGNOSIS — I252 Old myocardial infarction: Secondary | ICD-10-CM | POA: Diagnosis not present

## 2021-05-09 DIAGNOSIS — E1136 Type 2 diabetes mellitus with diabetic cataract: Secondary | ICD-10-CM | POA: Insufficient documentation

## 2021-05-09 DIAGNOSIS — H02831 Dermatochalasis of right upper eyelid: Secondary | ICD-10-CM | POA: Insufficient documentation

## 2021-05-09 DIAGNOSIS — K219 Gastro-esophageal reflux disease without esophagitis: Secondary | ICD-10-CM | POA: Insufficient documentation

## 2021-05-09 DIAGNOSIS — H02834 Dermatochalasis of left upper eyelid: Secondary | ICD-10-CM | POA: Insufficient documentation

## 2021-05-09 DIAGNOSIS — H11153 Pinguecula, bilateral: Secondary | ICD-10-CM | POA: Insufficient documentation

## 2021-05-09 DIAGNOSIS — Z955 Presence of coronary angioplasty implant and graft: Secondary | ICD-10-CM | POA: Diagnosis not present

## 2021-05-09 DIAGNOSIS — I2510 Atherosclerotic heart disease of native coronary artery without angina pectoris: Secondary | ICD-10-CM

## 2021-05-09 DIAGNOSIS — H2511 Age-related nuclear cataract, right eye: Secondary | ICD-10-CM | POA: Diagnosis not present

## 2021-05-09 DIAGNOSIS — F419 Anxiety disorder, unspecified: Secondary | ICD-10-CM | POA: Diagnosis not present

## 2021-05-09 HISTORY — PX: CATARACT EXTRACTION W/PHACO: SHX586

## 2021-05-09 LAB — GLUCOSE, CAPILLARY: Glucose-Capillary: 179 mg/dL — ABNORMAL HIGH (ref 70–99)

## 2021-05-09 SURGERY — PHACOEMULSIFICATION, CATARACT, WITH IOL INSERTION
Anesthesia: Monitor Anesthesia Care | Site: Eye | Laterality: Right

## 2021-05-09 MED ORDER — LIDOCAINE HCL 3.5 % OP GEL: 1.0000 "application " | Freq: Once | OPHTHALMIC | Status: DC

## 2021-05-09 MED ORDER — POVIDONE-IODINE 5 % OP SOLN
OPHTHALMIC | Status: DC | PRN
Start: 2021-05-09 — End: 2021-05-09
  Administered 2021-05-09: 1 via OPHTHALMIC

## 2021-05-09 MED ORDER — EPINEPHRINE PF 1 MG/ML IJ SOLN
INTRAOCULAR | Status: DC | PRN
  Administered 2021-05-09: 1 mL via OPHTHALMIC

## 2021-05-09 MED ORDER — SODIUM HYALURONATE 23MG/ML IO SOSY
PREFILLED_SYRINGE | INTRAOCULAR | Status: DC | PRN
  Administered 2021-05-09: 0.6 mL via INTRAOCULAR

## 2021-05-09 MED ORDER — MIDAZOLAM HCL 2 MG/2ML IJ SOLN
INTRAMUSCULAR | Status: AC
  Filled 2021-05-09: qty 2

## 2021-05-09 MED ORDER — SODIUM HYALURONATE 10 MG/ML IO SOLUTION
PREFILLED_SYRINGE | INTRAOCULAR | Status: DC | PRN
  Administered 2021-05-09: 0.85 mL via INTRAOCULAR

## 2021-05-09 MED ORDER — TROPICAMIDE 1 % OP SOLN
1.0000 [drp] | OPHTHALMIC | Status: AC | PRN
  Administered 2021-05-09 (×3): 1 [drp] via OPHTHALMIC
  Filled 2021-05-09: qty 2

## 2021-05-09 MED ORDER — STERILE WATER FOR IRRIGATION IR SOLN
Status: DC | PRN
  Administered 2021-05-09: 250 mL

## 2021-05-09 MED ORDER — PHENYLEPHRINE HCL 2.5 % OP SOLN
1.0000 [drp] | OPHTHALMIC | Status: AC | PRN
  Administered 2021-05-09 (×3): 1 [drp] via OPHTHALMIC

## 2021-05-09 MED ORDER — TETRACAINE HCL 0.5 % OP SOLN
1.0000 [drp] | OPHTHALMIC | Status: AC | PRN
  Administered 2021-05-09 (×3): 1 [drp] via OPHTHALMIC

## 2021-05-09 MED ORDER — NEOMYCIN-POLYMYXIN-DEXAMETH 3.5-10000-0.1 OP SUSP
OPHTHALMIC | Status: DC | PRN
  Administered 2021-05-09: 1 [drp] via OPHTHALMIC

## 2021-05-09 MED ORDER — EPINEPHRINE PF 1 MG/ML IJ SOLN
INTRAOCULAR | Status: DC | PRN
  Administered 2021-05-09: 500 mL

## 2021-05-09 MED ORDER — EPINEPHRINE PF 1 MG/ML IJ SOLN
INTRAMUSCULAR | Status: AC
  Filled 2021-05-09: qty 1

## 2021-05-09 MED ORDER — BSS IO SOLN
INTRAOCULAR | Status: DC | PRN
  Administered 2021-05-09: 15 mL via INTRAOCULAR

## 2021-05-09 SURGICAL SUPPLY — 19 items
CATARACT SUITE SIGHTPATH (MISCELLANEOUS) ×2 IMPLANT
CLOTH BEACON ORANGE TIMEOUT ST (SAFETY) ×2 IMPLANT
EYE SHIELD UNIVERSAL CLEAR (GAUZE/BANDAGES/DRESSINGS) ×1 IMPLANT
FEE CATARACT SUITE SIGHTPATH (MISCELLANEOUS) ×1 IMPLANT
GLOVE SURG UNDER POLY LF SZ6.5 (GLOVE) ×1 IMPLANT
GLOVE SURG UNDER POLY LF SZ7 (GLOVE) ×2 IMPLANT
GOWN STRL REUS W/ TWL LRG LVL3 (GOWN DISPOSABLE) IMPLANT
GOWN STRL REUS W/TWL LRG LVL3 (GOWN DISPOSABLE) ×2
LENS IOL EYHANCE TORIC II 21.0 ×2 IMPLANT
LENS IOL EYHANCE TRC II 21.0 IMPLANT
LENS IOL EYHNC TORIC 50 21.0 ×1 IMPLANT
NDL HYPO 18GX1.5 BLUNT FILL (NEEDLE) ×1 IMPLANT
NEEDLE HYPO 18GX1.5 BLUNT FILL (NEEDLE) ×2 IMPLANT
PAD ARMBOARD 7.5X6 YLW CONV (MISCELLANEOUS) ×2 IMPLANT
RING MALYGIN 7.0 (MISCELLANEOUS) IMPLANT
SYR TB 1ML LL NO SAFETY (SYRINGE) ×2 IMPLANT
TAPE SURG TRANSPORE 1 IN (GAUZE/BANDAGES/DRESSINGS) IMPLANT
TAPE SURGICAL TRANSPORE 1 IN (GAUZE/BANDAGES/DRESSINGS) ×2
WATER STERILE IRR 250ML POUR (IV SOLUTION) ×2 IMPLANT

## 2021-05-09 NOTE — Anesthesia Procedure Notes (Signed)
Procedure Name: MAC Date/Time: 05/09/2021 8:01 AM Performed by: Orlie Dakin, CRNA Pre-anesthesia Checklist: Patient identified, Emergency Drugs available, Suction available and Patient being monitored Patient Re-evaluated:Patient Re-evaluated prior to induction Oxygen Delivery Method: Nasal cannula Induction Type: IV induction Placement Confirmation: positive ETCO2

## 2021-05-09 NOTE — Transfer of Care (Signed)
Immediate Anesthesia Transfer of Care Note  Patient: Ronnie Maynard  Procedure(s) Performed: CATARACT EXTRACTION PHACO AND INTRAOCULAR LENS PLACEMENT (IOC) (Right: Eye)  Patient Location: Short Stay  Anesthesia Type:MAC  Level of Consciousness: awake, alert  and oriented  Airway & Oxygen Therapy: Patient Spontanous Breathing  Post-op Assessment: Report given to RN, Post -op Vital signs reviewed and stable and Patient moving all extremities X 4  Post vital signs: Reviewed and stable  Last Vitals:  Vitals Value Taken Time  BP    Temp    Pulse    Resp    SpO2      Last Pain:  Vitals:   05/09/21 0657  PainSc: 0-No pain         Complications: No notable events documented.

## 2021-05-09 NOTE — Anesthesia Preprocedure Evaluation (Signed)
Anesthesia Evaluation  Patient identified by MRN, date of birth, ID band Patient awake    Reviewed: Allergy & Precautions, NPO status , Patient's Chart, lab work & pertinent test results, reviewed documented beta blocker date and time   Airway Mallampati: II  TM Distance: >3 FB Neck ROM: Full    Dental  (+) Edentulous Lower, Upper Dentures   Pulmonary shortness of breath and with exertion, COPD,  COPD inhaler, former smoker,    Pulmonary exam normal breath sounds clear to auscultation       Cardiovascular Exercise Tolerance: Good hypertension, Pt. on medications and Pt. on home beta blockers + CAD, + Past MI and + Cardiac Stents  Normal cardiovascular exam Rhythm:Regular Rate:Normal  Sinus  Rhythm  -  Nonspecific T-abnormality   Neuro/Psych PSYCHIATRIC DISORDERS Anxiety Depression    GI/Hepatic GERD  Medicated and Controlled,(+)     substance abuse  alcohol use,   Endo/Other  diabetes, Well Controlled, Type 2, Oral Hypoglycemic Agents  Renal/GU      Musculoskeletal   Abdominal   Peds  Hematology   Anesthesia Other Findings   Reproductive/Obstetrics                             Anesthesia Physical  Anesthesia Plan  ASA: 3  Anesthesia Plan: MAC   Post-op Pain Management: Minimal or no pain anticipated   Induction:   PONV Risk Score and Plan: Treatment may vary due to age or medical condition  Airway Management Planned: Nasal Cannula and Natural Airway  Additional Equipment:   Intra-op Plan:   Post-operative Plan:   Informed Consent: I have reviewed the patients History and Physical, chart, labs and discussed the procedure including the risks, benefits and alternatives for the proposed anesthesia with the patient or authorized representative who has indicated his/her understanding and acceptance.       Plan Discussed with: CRNA and Surgeon  Anesthesia Plan Comments:          Anesthesia Quick Evaluation

## 2021-05-09 NOTE — Op Note (Signed)
Date of procedure: 05/09/21  Pre-operative diagnosis: Visually significant age-related nuclear cataract, Right Eye; Visually Significant Astigmatism, Right Eye (H25.11)  Post-operative diagnosis: Visually significant age-related cataract, Right Eye; Visually Significant Astigmatism, Right Eye  Procedure: Removal of cataract via phacoemulsification and insertion of intra-ocular lens Wynetta Emery and Johnson [id number] +21.0D into the capsular bag of the Right Eye  Attending surgeon: Gerda Diss. Brielyn Bosak, MD, MA  Anesthesia: MAC, Topical Akten  Complications: None  Estimated Blood Loss: <52m (minimal)  Specimens: None  Implants: As above  Indications:  Visually significant age-related cataract, Right Eye; Visually Significant Astigmatism, Right Eye  Procedure:  The patient was seen and identified in the pre-operative area. The operative eye was identified and dilated.  The operative eye was marked.  Pre-operative toric markers were used to mark the eye at 0 and 180 degrees. Topical anesthesia was administered to the operative eye.     The patient was then to the operative suite and placed in the supine position.  A timeout was performed confirming the patient, procedure to be performed, and all other relevant information.   The patient's face was prepped and draped in the usual fashion for intra-ocular surgery.  A lid speculum was placed into the operative eye and the surgical microscope moved into place and focused.  A superotemporal paracentesis was created using a 20 gauge paracentesis blade.  Shugarcaine was injected into the anterior chamber.  Viscoelastic was injected into the anterior chamber.  A temporal clear-corneal main wound incision was created using a 2.487mmicrokeratome.  A continuous curvilinear capsulorrhexis was initiated using an irrigating cystitome and completed using capsulorrhexis forceps.  Hydrodissection and hydrodeliniation were performed.  Viscoelastic was injected into the  anterior chamber.  A phacoemulsification handpiece and a chopper as a second instrument were used to remove the nucleus and epinucleus. The irrigation/aspiration handpiece was used to remove any remaining cortical material.   The capsular bag was reinflated with viscoelastic, checked, and found to be intact.  The intraocular lens was inserted into the capsular bag and dialed into place using a Kuglen hook to 104 degrees.  The irrigation/aspiration handpiece was used to remove any remaining viscoelastic.  The clear corneal wound and paracentesis wounds were then hydrated and checked with Weck-Cels to be watertight.  The lid-speculum and drape was removed, and the patient's face was cleaned with a wet and dry 4x4.  Maxitrol was instilled in the eye before a clear shield was taped over the eye. The patient was taken to the post-operative care unit in good condition, having tolerated the procedure well.  Post-Op Instructions: [facility] and will receive all other orders and instructions.

## 2021-05-09 NOTE — Discharge Instructions (Addendum)
Please discharge patient when stable, will follow up today with Dr. Wrzosek at the [facility] [place] office immediately following discharge.  Leave shield in place until visit.  All paperwork with discharge instructions will be given at the office.  [facility] Address:  [address]  [place], [place] [address]  

## 2021-05-09 NOTE — Interval H&P Note (Signed)
History and Physical Interval Note:  05/09/2021 7:54 AM  Ronnie Maynard  has presented today for surgery, with the diagnosis of nuclear sclerosis age related cataract; right eye.  The various methods of treatment have been discussed with the patient and family. After consideration of risks, benefits and other options for treatment, the patient has consented to  Procedure(s) with comments: CATARACT EXTRACTION PHACO AND INTRAOCULAR LENS PLACEMENT (IOC) (Right) - right as a surgical intervention.  The patient's history has been reviewed, patient examined, no change in status, stable for surgery.  I have reviewed the patient's chart and labs.  Questions were answered to the patient's satisfaction.     Fabio Pierce

## 2021-05-09 NOTE — Anesthesia Postprocedure Evaluation (Signed)
Anesthesia Post Note  Patient: Ronnie Maynard  Procedure(s) Performed: CATARACT EXTRACTION PHACO AND INTRAOCULAR LENS PLACEMENT (IOC) (Right: Eye)  Patient location during evaluation: Phase II Anesthesia Type: MAC Level of consciousness: awake and alert and oriented Pain management: pain level controlled Vital Signs Assessment: post-procedure vital signs reviewed and stable Respiratory status: spontaneous breathing, nonlabored ventilation and respiratory function stable Cardiovascular status: blood pressure returned to baseline and stable Postop Assessment: no apparent nausea or vomiting Anesthetic complications: no   No notable events documented.   Last Vitals:  Vitals:   05/09/21 0700 05/09/21 0822  BP: 123/73 127/76  Pulse: 70 66  Resp: (!) 23 18  Temp: 36.4 C 36.7 C  SpO2: 96% 94%    Last Pain:  Vitals:   05/09/21 0822  TempSrc: Oral  PainSc: 0-No pain                 Zarriah Starkel C Handy Mcloud

## 2021-05-12 ENCOUNTER — Encounter (HOSPITAL_COMMUNITY): Payer: Self-pay | Admitting: Ophthalmology

## 2021-05-15 DIAGNOSIS — H2512 Age-related nuclear cataract, left eye: Secondary | ICD-10-CM | POA: Diagnosis not present

## 2021-05-16 DIAGNOSIS — H2511 Age-related nuclear cataract, right eye: Secondary | ICD-10-CM | POA: Diagnosis not present

## 2021-05-20 ENCOUNTER — Encounter (HOSPITAL_COMMUNITY)
Admission: RE | Admit: 2021-05-20 | Discharge: 2021-05-20 | Disposition: A | Discharge status: Home / Self Care | Payer: Medicare Other | Source: Ambulatory Visit | Attending: Ophthalmology | Admitting: Ophthalmology

## 2021-05-20 DIAGNOSIS — E1165 Type 2 diabetes mellitus with hyperglycemia: Secondary | ICD-10-CM | POA: Diagnosis not present

## 2021-05-20 NOTE — H&P (Signed)
Surgical History & Physical  Patient Name: Ronnie Maynard DOB: [birth year]  Surgery: Cataract extraction with intraocular lens implant phacoemulsification; Left Eye  Surgeon: Baruch Goldmann MD Surgery Date:  05-23-21 Pre-Op Date:  05-15-21  HPI: A 44 Yr. old male patient is returning after cataract post-op. The right eye is affected. Status post cataract post-op, which began 1 weeks ago: Since the last visit, the affected area is doing well. The patient's vision is improved at a distance, but patient states vision is still blurry up close, and states that he cannot read fine print on labels. Patient is following medication instructions, the patient is using the combination post op drops TID OD. The patient experiences no eye pain and no flashes, floater, shadow, curtain or veil. The patient also presents for pre op OS. The patient still complains of difficult with reading labels on food boxes, and other fine print. The patient states this is negatively affecting the patient's quality of life and the patient is unable to function adequately in life with the current level of vision. HPI was performed by Baruch Goldmann .  Medical History: Cataracts Strabismus OD, Presbyopia, Astigmatism Diabetes Heart Problem High Blood Pressure LDL Lung Problems  Review of Systems Negative Allergic/Immunologic Negative Cardiovascular Negative Constitutional Negative Ear, Nose, Mouth & Throat Negative Endocrine Negative Eyes Negative Gastrointestinal Negative Genitourinary Negative Hemotologic/Lymphatic Negative Integumentary Negative Musculoskeletal Negative Neurological Negative Psychiatry Negative Respiratory  Social   Former smoker   Medication Prednisolone-Moxifloxacin-Bromfenac, Aspirin, Duloxetine, Gabapentin, Ibuprofen, Invokana, Januvia, Lisinopril, Metformin, Metoprolol, Omeprazole, Rosuvastatin, Bladder control med,   Sx/Procedures Phaco c IOL OD Toric,  Heart Stents,   Drug  Allergies   NKDA Diabetes  History & Physical: Heent: Cataract, Left Eye NECK: supple without bruits LUNGS: lungs clear to auscultation CV: regular rate and rhythm Abdomen: soft and non-tender  Impression & Plan: Assessment: 1.  CATARACT EXTRACTION STATUS; Right Eye (Z98.41) 2.  NUCLEAR SCLEROSIS AGE RELATED; , Left Eye (H25.12) 3.  ASTIGMATISM, REGULAR; Both Eyes (H52.223)  Plan: 1.  1 week after cataract surgery. Doing well with improved vision and normal eye pressure. Call with any problems or concerns. Continue Pred-Moxi-Brom 2x/day for 3 more weeks.  2.  Dilates well - shugarcaine by protocol. Toric Lens. - Eyehance of Synergy Toric. Cataract accounts for the patient's decreased vision. This visual impairment is not correctable with a tolerable change in glasses or contact lenses. Cataract surgery with an implantation of a new lens should significantly improve the visual and functional status of the patient. Discussed all risks, benefits, alternatives, and potential complications. Discussed the procedures and recovery. Patient desires to have surgery. A-scan ordered and performed today for intra-ocular lens calculations. The surgery will be performed in order to improve vision for driving, reading, and for eye examinations. Recommend phacoemulsification with intra-ocular lens. Recommend Dextenza for post-operative pain and inflammation. Left Eye. Surgery required to correct imbalance of vision.  3.  Toric IOL as above OU.

## 2021-05-23 ENCOUNTER — Ambulatory Visit (HOSPITAL_BASED_OUTPATIENT_CLINIC_OR_DEPARTMENT_OTHER): Payer: Medicare Other | Admitting: Anesthesiology

## 2021-05-23 ENCOUNTER — Ambulatory Visit (HOSPITAL_COMMUNITY)
Admission: RE | Admit: 2021-05-23 | Discharge: 2021-05-23 | Disposition: A | Discharge status: Home / Self Care | Payer: Medicare Other | Source: Ambulatory Visit | Attending: Ophthalmology | Admitting: Ophthalmology

## 2021-05-23 ENCOUNTER — Encounter (HOSPITAL_COMMUNITY)
Admission: RE | Disposition: A | Discharge status: Home / Self Care | Payer: Self-pay | Source: Ambulatory Visit | Attending: Ophthalmology

## 2021-05-23 ENCOUNTER — Ambulatory Visit (HOSPITAL_COMMUNITY): Payer: Medicare Other | Admitting: Anesthesiology

## 2021-05-23 ENCOUNTER — Encounter (HOSPITAL_COMMUNITY): Payer: Self-pay | Admitting: Ophthalmology

## 2021-05-23 DIAGNOSIS — I10 Essential (primary) hypertension: Secondary | ICD-10-CM | POA: Diagnosis not present

## 2021-05-23 DIAGNOSIS — K219 Gastro-esophageal reflux disease without esophagitis: Secondary | ICD-10-CM | POA: Diagnosis not present

## 2021-05-23 DIAGNOSIS — Z9841 Cataract extraction status, right eye: Secondary | ICD-10-CM | POA: Diagnosis not present

## 2021-05-23 DIAGNOSIS — F419 Anxiety disorder, unspecified: Secondary | ICD-10-CM | POA: Insufficient documentation

## 2021-05-23 DIAGNOSIS — E1136 Type 2 diabetes mellitus with diabetic cataract: Secondary | ICD-10-CM | POA: Diagnosis not present

## 2021-05-23 DIAGNOSIS — F32A Depression, unspecified: Secondary | ICD-10-CM | POA: Insufficient documentation

## 2021-05-23 DIAGNOSIS — I252 Old myocardial infarction: Secondary | ICD-10-CM | POA: Insufficient documentation

## 2021-05-23 DIAGNOSIS — I2510 Atherosclerotic heart disease of native coronary artery without angina pectoris: Secondary | ICD-10-CM

## 2021-05-23 DIAGNOSIS — Z87891 Personal history of nicotine dependence: Secondary | ICD-10-CM | POA: Insufficient documentation

## 2021-05-23 DIAGNOSIS — H52202 Unspecified astigmatism, left eye: Secondary | ICD-10-CM | POA: Insufficient documentation

## 2021-05-23 DIAGNOSIS — H2512 Age-related nuclear cataract, left eye: Secondary | ICD-10-CM | POA: Diagnosis not present

## 2021-05-23 DIAGNOSIS — J449 Chronic obstructive pulmonary disease, unspecified: Secondary | ICD-10-CM | POA: Diagnosis not present

## 2021-05-23 DIAGNOSIS — H52222 Regular astigmatism, left eye: Secondary | ICD-10-CM | POA: Diagnosis not present

## 2021-05-23 HISTORY — PX: CATARACT EXTRACTION W/PHACO: SHX586

## 2021-05-23 LAB — GLUCOSE, CAPILLARY: Glucose-Capillary: 114 mg/dL — ABNORMAL HIGH (ref 70–99)

## 2021-05-23 SURGERY — PHACOEMULSIFICATION, CATARACT, WITH IOL INSERTION
Anesthesia: Monitor Anesthesia Care | Site: Eye | Laterality: Left

## 2021-05-23 MED ORDER — TETRACAINE HCL 0.5 % OP SOLN
1.0000 [drp] | OPHTHALMIC | Status: AC | PRN
  Administered 2021-05-23 (×3): 1 [drp] via OPHTHALMIC

## 2021-05-23 MED ORDER — SODIUM HYALURONATE 23MG/ML IO SOSY
PREFILLED_SYRINGE | INTRAOCULAR | Status: DC | PRN
Start: 2021-05-23 — End: 2021-05-23
  Administered 2021-05-23: 0.6 mL via INTRAOCULAR

## 2021-05-23 MED ORDER — BSS IO SOLN
INTRAOCULAR | Status: DC | PRN
Start: 2021-05-23 — End: 2021-05-23
  Administered 2021-05-23: 15 mL via INTRAOCULAR

## 2021-05-23 MED ORDER — PHENYLEPHRINE HCL 2.5 % OP SOLN
1.0000 [drp] | OPHTHALMIC | Status: AC | PRN
  Administered 2021-05-23 (×3): 1 [drp] via OPHTHALMIC

## 2021-05-23 MED ORDER — EPINEPHRINE PF 1 MG/ML IJ SOLN
INTRAOCULAR | Status: DC | PRN
  Administered 2021-05-23: 500 mL

## 2021-05-23 MED ORDER — NEOMYCIN-POLYMYXIN-DEXAMETH 3.5-10000-0.1 OP SUSP
OPHTHALMIC | Status: DC | PRN
Start: 2021-05-23 — End: 2021-05-23
  Administered 2021-05-23: 1 [drp] via OPHTHALMIC

## 2021-05-23 MED ORDER — TROPICAMIDE 1 % OP SOLN
1.0000 [drp] | OPHTHALMIC | Status: AC | PRN
  Administered 2021-05-23 (×3): 1 [drp] via OPHTHALMIC
  Filled 2021-05-23: qty 2

## 2021-05-23 MED ORDER — SODIUM HYALURONATE 10 MG/ML IO SOLUTION
PREFILLED_SYRINGE | INTRAOCULAR | Status: DC | PRN
  Administered 2021-05-23: 0.85 mL via INTRAOCULAR

## 2021-05-23 MED ORDER — STERILE WATER FOR IRRIGATION IR SOLN
Status: DC | PRN
  Administered 2021-05-23: 250 mL

## 2021-05-23 MED ORDER — POVIDONE-IODINE 5 % OP SOLN
OPHTHALMIC | Status: DC | PRN
  Administered 2021-05-23: 1 via OPHTHALMIC

## 2021-05-23 MED ORDER — EPINEPHRINE PF 1 MG/ML IJ SOLN
INTRAMUSCULAR | Status: AC
  Filled 2021-05-23: qty 2

## 2021-05-23 MED ORDER — LIDOCAINE HCL 3.5 % OP GEL
1.0000 "application " | Freq: Once | OPHTHALMIC | Status: AC
  Administered 2021-05-23: 1 via OPHTHALMIC

## 2021-05-23 MED ORDER — LIDOCAINE HCL (PF) 1 % IJ SOLN
INTRAOCULAR | Status: DC | PRN
  Administered 2021-05-23: 1 mL via OPHTHALMIC

## 2021-05-23 SURGICAL SUPPLY — 20 items
CATARACT SUITE SIGHTPATH (MISCELLANEOUS) ×2 IMPLANT
CLOTH BEACON ORANGE TIMEOUT ST (SAFETY) ×2 IMPLANT
DRAPE EENT ADH APERT 31X51 STR (DRAPES) ×1 IMPLANT
DRAPE HALF SHEET 40X57 (DRAPES) ×1 IMPLANT
EYE SHIELD UNIVERSAL CLEAR (GAUZE/BANDAGES/DRESSINGS) ×1 IMPLANT
FEE CATARACT SUITE SIGHTPATH (MISCELLANEOUS) ×1 IMPLANT
GLOVE SURG UNDER POLY LF SZ6.5 (GLOVE) ×2 IMPLANT
GLOVE SURG UNDER POLY LF SZ7 (GLOVE) ×1 IMPLANT
GOWN STRL REUS W/TWL LRG LVL3 (GOWN DISPOSABLE) ×1 IMPLANT
LENS IOL EYHANCE TORIC II 21.5 ×2 IMPLANT
LENS IOL EYHANCE TRC 225 21.5 IMPLANT
LENS IOL EYHNC TORIC 225 21.5 ×1 IMPLANT
NDL HYPO 18GX1.5 BLUNT FILL (NEEDLE) ×1 IMPLANT
NEEDLE HYPO 18GX1.5 BLUNT FILL (NEEDLE) ×2 IMPLANT
PAD ARMBOARD 7.5X6 YLW CONV (MISCELLANEOUS) ×2 IMPLANT
RING MALYGIN 7.0 (MISCELLANEOUS) IMPLANT
SYR TB 1ML LL NO SAFETY (SYRINGE) ×2 IMPLANT
TAPE SURG TRANSPORE 1 IN (GAUZE/BANDAGES/DRESSINGS) IMPLANT
TAPE SURGICAL TRANSPORE 1 IN (GAUZE/BANDAGES/DRESSINGS) ×2
WATER STERILE IRR 250ML POUR (IV SOLUTION) ×2 IMPLANT

## 2021-05-23 NOTE — Op Note (Signed)
Date of procedure: 05/23/21 ? ?Pre-operative diagnosis: Visually significant age-related nuclear cataract, Left Eye; Visually Significant Astigmatism, Left Eye (H25.12) ? ?Post-operative diagnosis: Visually significant age-related cataract, Left Eye; Visually Significant Astigmatism, Left Eye ? ?Procedure: Removal of cataract via phacoemulsification and insertion of intra-ocular lens Wynetta Emery and Johnson [id number] +21.5D into the capsular bag of the Left Eye ? ?Attending surgeon: Gerda Diss. Marisa Hua, MD, MA ? ?Anesthesia: MAC, Topical Akten ? ?Complications: None ? ?Estimated Blood Loss: <12m (minimal) ? ?Specimens: None ? ?Implants: As above ? ?Indications:  Visually significant age-related cataract, Left Eye; Visually Significant Astigmatism, Left Eye ? ?Procedure:  ?The patient was seen and identified in the pre-operative area. The operative eye was identified and dilated.  The operative eye was marked.  Pre-operative toric markers were used to mark the eye at 0 and 180 degrees. Topical anesthesia was administered to the operative eye.    ? ?The patient was then to the operative suite and placed in the supine position.  A timeout was performed confirming the patient, procedure to be performed, and all other relevant information.   The patient's face was prepped and draped in the usual fashion for intra-ocular surgery.  A lid speculum was placed into the operative eye and the surgical microscope moved into place and focused.  A superotemporal paracentesis was created using a 20 gauge paracentesis blade.  Shugarcaine was injected into the anterior chamber.  Viscoelastic was injected into the anterior chamber.  A temporal clear-corneal main wound incision was created using a 2.49mmicrokeratome.  A continuous curvilinear capsulorrhexis was initiated using an irrigating cystitome and completed using capsulorrhexis forceps.  Hydrodissection and hydrodeliniation were performed.  Viscoelastic was injected into the anterior  chamber.  A phacoemulsification handpiece and a chopper as a second instrument were used to remove the nucleus and epinucleus. The irrigation/aspiration handpiece was used to remove any remaining cortical material.  ? ?The capsular bag was reinflated with viscoelastic, checked, and found to be intact.  The eye was marked to the per-op meridian.  The intraocular lens was inserted into the capsular bag and dialed into place using a Kuglen hook to 75 degrees.  The irrigation/aspiration handpiece was used to remove any remaining viscoelastic.  The clear corneal wound and paracentesis wounds were then hydrated and checked with Weck-Cels to be watertight.  The lid-speculum and drape was removed, and the patient's face was cleaned with a wet and dry 4x4.  Maxitrol was instilled in the eye before a clear shield was taped over the eye. The patient was taken to the post-operative care unit in good condition, having tolerated the procedure well. ? ?Post-Op Instructions: [facility] and will receive all other orders and instructions.  ?

## 2021-05-23 NOTE — Anesthesia Procedure Notes (Signed)
Procedure Name: [facility] ?Date/Time: 05/23/2021 2:27 PM ?Performed by: Orlie Dakin, CRNA ?Pre-anesthesia Checklist: Patient identified, Emergency Drugs available, Suction available and Patient being monitored ?Patient Re-evaluated:Patient Re-evaluated prior to induction ?Oxygen Delivery Method: Nasal cannula ?Placement Confirmation: positive ETCO2 ? ? ? ? ?

## 2021-05-23 NOTE — Anesthesia Postprocedure Evaluation (Signed)
Anesthesia Post Note ? ?Patient: Ronnie Maynard ? ?Procedure(s) Performed: CATARACT EXTRACTION PHACO AND INTRAOCULAR LENS PLACEMENT (IOC) (Left: Eye) ? ?Patient location during evaluation: Phase II ?Anesthesia Type: MAC ?Level of consciousness: awake and alert and oriented ?Pain management: pain level controlled ?Vital Signs Assessment: post-procedure vital signs reviewed and stable ?Respiratory status: spontaneous breathing, nonlabored ventilation and respiratory function stable ?Cardiovascular status: stable and blood pressure returned to baseline ?Postop Assessment: no apparent nausea or vomiting ?Anesthetic complications: no ? ? ?No notable events documented. ? ? ?Last Vitals:  ?Vitals:  ? 05/23/21 1321 05/23/21 1454  ?BP: 128/65 122/62  ?Pulse:  63  ?Resp: 18 18  ?Temp: 36.7 ?C 36.7 ?C  ?SpO2: 97% 98%  ?  ?Last Pain:  ?Vitals:  ? 05/23/21 1454  ?TempSrc: Oral  ?PainSc: 0-No pain  ? ? ?  ?  ?  ?  ?  ?  ? ?Tiny Rietz C Ronell Duffus ? ? ? ? ?

## 2021-05-23 NOTE — Transfer of Care (Signed)
Immediate Anesthesia Transfer of Care Note ? ?Patient: Ronnie Maynard ? ?Procedure(s) Performed: CATARACT EXTRACTION PHACO AND INTRAOCULAR LENS PLACEMENT (IOC) (Left: Eye) ? ?Patient Location: Short Stay ? ?Anesthesia Type:MAC ? ?Level of Consciousness: awake, alert  and oriented ? ?Airway & Oxygen Therapy: Patient Spontanous Breathing ? ?Post-op Assessment: Report given to RN and Post -op Vital signs reviewed and stable ? ?Post vital signs: Reviewed and stable ? ?Last Vitals:  ?Vitals Value Taken Time  ?BP    ?Temp    ?Pulse    ?Resp    ?SpO2    ? ? ?Last Pain:  ?Vitals:  ? 05/23/21 1321  ?TempSrc: Oral  ?PainSc: 0-No pain  ?   ? ?Patients Stated Pain Goal: 6 (05/23/21 1321) ? ?Complications: No notable events documented. ?

## 2021-05-23 NOTE — Anesthesia Preprocedure Evaluation (Signed)
Anesthesia Evaluation  ?Patient identified by MRN, date of birth, ID band ?Patient awake ? ? ? ?Reviewed: ?Allergy & Precautions, NPO status , Patient's Chart, lab work & pertinent test results, reviewed documented beta blocker date and time  ? ?Airway ?Mallampati: II ? ?TM Distance: >3 FB ?Neck ROM: Full ? ? ? Dental ? ?(+) Edentulous Lower, Upper Dentures ?  ?Pulmonary ?shortness of breath and with exertion, COPD,  COPD inhaler, former smoker,  ?  ?Pulmonary exam normal ?breath sounds clear to auscultation ? ? ? ? ? ? Cardiovascular ?Exercise Tolerance: Good ?hypertension, Pt. on medications and Pt. on home beta blockers ?+ CAD and + Past MI  ?Normal cardiovascular exam ?Rhythm:Regular Rate:Normal ? ?Sinus  Rhythm  ?-  Nonspecific T-abnormality ?  ?Neuro/Psych ?PSYCHIATRIC DISORDERS Anxiety Depression negative neurological ROS ?   ? GI/Hepatic ?Neg liver ROS, GERD  Medicated,(+)  ?  ?  ? alcohol use,   ?Endo/Other  ?diabetes, Well Controlled, Type 2, Oral Hypoglycemic Agents ? Renal/GU ?negative Renal ROS  ?negative genitourinary ?  ?Musculoskeletal ?negative musculoskeletal ROS ?(+)  ? Abdominal ?  ?Peds ?negative pediatric ROS ?(+)  Hematology ?negative hematology ROS ?(+)   ?Anesthesia Other Findings ? ? Reproductive/Obstetrics ?negative OB ROS ? ?  ? ? ? ? ? ? ? ? ? ? ? ? ? ?  ?  ? ? ? ? ? ? ? ?Anesthesia Physical ? ?Anesthesia Plan ? ?ASA: 3 ? ?Anesthesia Plan: MAC  ? ?Post-op Pain Management: Minimal or no pain anticipated  ? ?Induction:  ? ?PONV Risk Score and Plan: Treatment may vary due to age or medical condition ? ?Airway Management Planned: Nasal Cannula and Natural Airway ? ?Additional Equipment:  ? ?Intra-op Plan:  ? ?Post-operative Plan:  ? ?Informed Consent: I have reviewed the patients History and Physical, chart, labs and discussed the procedure including the risks, benefits and alternatives for the proposed anesthesia with the patient or authorized  representative who has indicated his/her understanding and acceptance.  ? ? ? ? ? ?Plan Discussed with: CRNA and Surgeon ? ?Anesthesia Plan Comments:   ? ? ? ? ? ? ?Anesthesia Quick Evaluation                                  Anesthesia Evaluation  ?Patient identified by MRN, date of birth, ID band ?Patient awake ? ? ? ?Reviewed: ?Allergy & Precautions, NPO status , Patient's Chart, lab work & pertinent test results, reviewed documented beta blocker date and time  ? ?Airway ?Mallampati: II ? ?TM Distance: >3 FB ?Neck ROM: Full ? ? ? Dental ? ?(+) Edentulous Lower, Upper Dentures ?  ?Pulmonary ?shortness of breath and with exertion, COPD,  COPD inhaler, former smoker,  ?  ?Pulmonary exam normal ?breath sounds clear to auscultation ? ? ? ? ? ? Cardiovascular ?Exercise Tolerance: Good ?hypertension, Pt. on medications and Pt. on home beta blockers ?+ CAD, + Past MI and + Cardiac Stents  ?Normal cardiovascular exam ?Rhythm:Regular Rate:Normal ? ?Sinus  Rhythm  ?-  Nonspecific T-abnormality ?  ?Neuro/Psych ?PSYCHIATRIC DISORDERS Anxiety Depression   ? GI/Hepatic ?GERD  Medicated and Controlled,(+)  ?  ? substance abuse ? alcohol use,   ?Endo/Other  ?diabetes, Well Controlled, Type 2, Oral Hypoglycemic Agents ? Renal/GU ?  ? ?  ?Musculoskeletal ? ? Abdominal ?  ?Peds ? Hematology ?  ?Anesthesia Other Findings ? ? Reproductive/Obstetrics ? ?  ? ? ? ? ? ? ? ? ? ? ? ? ? ?  ?  ? ? ? ? ? ? ? ? ?  Anesthesia Physical ? ?Anesthesia Plan ? ?ASA: 3 ? ?Anesthesia Plan: MAC  ? ?Post-op Pain Management: Minimal or no pain anticipated  ? ?Induction:  ? ?PONV Risk Score and Plan: Treatment may vary due to age or medical condition ? ?Airway Management Planned: Nasal Cannula and Natural Airway ? ?Additional Equipment:  ? ?Intra-op Plan:  ? ?Post-operative Plan:  ? ?Informed Consent: I have reviewed the patients History and Physical, chart, labs and discussed the procedure including the risks, benefits and alternatives for the proposed  anesthesia with the patient or authorized representative who has indicated his/her understanding and acceptance.  ? ? ? ? ? ?Plan Discussed with: CRNA and Surgeon ? ?Anesthesia Plan Comments:   ? ? ? ? ? ? ?Anesthesia Quick Evaluation ? ? ?

## 2021-05-23 NOTE — Discharge Instructions (Signed)
Please discharge patient when stable, will follow up today with Dr. Lady Wisham at the [facility] office immediately following discharge.  Leave shield in place until visit.  All paperwork with discharge instructions will be given at the office.  [facility] Address:  [address]  [place], [place] [address]  

## 2021-05-23 NOTE — Interval H&P Note (Signed)
History and Physical Interval Note: ? ?05/23/2021 ?2:20 PM ? ?Ronnie Maynard  has presented today for surgery, with the diagnosis of nuclear sclerosis age related cataract; left eye.  The various methods of treatment have been discussed with the patient and family. After consideration of risks, benefits and other options for treatment, the patient has consented to  Procedure(s) with comments: ?CATARACT EXTRACTION PHACO AND INTRAOCULAR LENS PLACEMENT (IOC) (Left) - CDE:  as a surgical intervention.  The patient's history has been reviewed, patient examined, no change in status, stable for surgery.  I have reviewed the patient's chart and labs.  Questions were answered to the patient's satisfaction.   ? ? ?Ronnie Maynard ? ? ?

## 2021-05-27 ENCOUNTER — Encounter (HOSPITAL_COMMUNITY): Payer: Self-pay | Admitting: Ophthalmology

## 2021-05-30 DIAGNOSIS — H2512 Age-related nuclear cataract, left eye: Secondary | ICD-10-CM | POA: Diagnosis not present

## 2021-06-19 DIAGNOSIS — E1165 Type 2 diabetes mellitus with hyperglycemia: Secondary | ICD-10-CM | POA: Diagnosis not present

## 2021-06-30 DIAGNOSIS — Z87891 Personal history of nicotine dependence: Secondary | ICD-10-CM | POA: Diagnosis not present

## 2021-06-30 DIAGNOSIS — E1165 Type 2 diabetes mellitus with hyperglycemia: Secondary | ICD-10-CM | POA: Diagnosis not present

## 2021-06-30 DIAGNOSIS — Z299 Encounter for prophylactic measures, unspecified: Secondary | ICD-10-CM | POA: Diagnosis not present

## 2021-06-30 DIAGNOSIS — I10 Essential (primary) hypertension: Secondary | ICD-10-CM | POA: Diagnosis not present

## 2021-06-30 DIAGNOSIS — G4700 Insomnia, unspecified: Secondary | ICD-10-CM | POA: Diagnosis not present

## 2021-06-30 DIAGNOSIS — I509 Heart failure, unspecified: Secondary | ICD-10-CM | POA: Diagnosis not present

## 2021-07-20 DIAGNOSIS — E1165 Type 2 diabetes mellitus with hyperglycemia: Secondary | ICD-10-CM | POA: Diagnosis not present

## 2021-08-19 DIAGNOSIS — E1165 Type 2 diabetes mellitus with hyperglycemia: Secondary | ICD-10-CM | POA: Diagnosis not present

## 2021-09-08 DIAGNOSIS — I10 Essential (primary) hypertension: Secondary | ICD-10-CM | POA: Diagnosis not present

## 2021-09-08 DIAGNOSIS — Z0000 Encounter for general adult medical examination without abnormal findings: Secondary | ICD-10-CM | POA: Diagnosis not present

## 2021-09-08 DIAGNOSIS — Z299 Encounter for prophylactic measures, unspecified: Secondary | ICD-10-CM | POA: Diagnosis not present

## 2021-09-08 DIAGNOSIS — F17210 Nicotine dependence, cigarettes, uncomplicated: Secondary | ICD-10-CM | POA: Diagnosis not present

## 2021-09-08 DIAGNOSIS — Z7189 Other specified counseling: Secondary | ICD-10-CM | POA: Diagnosis not present

## 2021-09-08 DIAGNOSIS — G4700 Insomnia, unspecified: Secondary | ICD-10-CM | POA: Diagnosis not present

## 2021-09-18 DIAGNOSIS — E1165 Type 2 diabetes mellitus with hyperglycemia: Secondary | ICD-10-CM | POA: Diagnosis not present

## 2021-09-19 DIAGNOSIS — E785 Hyperlipidemia, unspecified: Secondary | ICD-10-CM | POA: Diagnosis not present

## 2021-09-19 DIAGNOSIS — E039 Hypothyroidism, unspecified: Secondary | ICD-10-CM | POA: Diagnosis not present

## 2021-09-19 DIAGNOSIS — I10 Essential (primary) hypertension: Secondary | ICD-10-CM | POA: Diagnosis not present

## 2021-09-19 DIAGNOSIS — E1142 Type 2 diabetes mellitus with diabetic polyneuropathy: Secondary | ICD-10-CM | POA: Diagnosis not present

## 2021-10-09 DIAGNOSIS — J449 Chronic obstructive pulmonary disease, unspecified: Secondary | ICD-10-CM | POA: Diagnosis not present

## 2021-10-09 DIAGNOSIS — I10 Essential (primary) hypertension: Secondary | ICD-10-CM | POA: Diagnosis not present

## 2021-10-09 DIAGNOSIS — K219 Gastro-esophageal reflux disease without esophagitis: Secondary | ICD-10-CM | POA: Diagnosis not present

## 2021-10-09 DIAGNOSIS — Z789 Other specified health status: Secondary | ICD-10-CM | POA: Diagnosis not present

## 2021-10-09 DIAGNOSIS — E119 Type 2 diabetes mellitus without complications: Secondary | ICD-10-CM | POA: Diagnosis not present

## 2021-10-09 DIAGNOSIS — Z299 Encounter for prophylactic measures, unspecified: Secondary | ICD-10-CM | POA: Diagnosis not present

## 2021-10-09 DIAGNOSIS — I25118 Atherosclerotic heart disease of native coronary artery with other forms of angina pectoris: Secondary | ICD-10-CM | POA: Diagnosis not present

## 2021-10-20 DIAGNOSIS — E1165 Type 2 diabetes mellitus with hyperglycemia: Secondary | ICD-10-CM | POA: Diagnosis not present

## 2021-11-13 ENCOUNTER — Other Ambulatory Visit: Payer: Self-pay | Admitting: Urology

## 2021-11-19 DIAGNOSIS — E1165 Type 2 diabetes mellitus with hyperglycemia: Secondary | ICD-10-CM | POA: Diagnosis not present

## 2021-12-15 ENCOUNTER — Encounter: Payer: Self-pay | Admitting: Internal Medicine

## 2021-12-19 DIAGNOSIS — E1165 Type 2 diabetes mellitus with hyperglycemia: Secondary | ICD-10-CM | POA: Diagnosis not present

## 2021-12-23 DIAGNOSIS — H35033 Hypertensive retinopathy, bilateral: Secondary | ICD-10-CM | POA: Diagnosis not present

## 2022-01-01 ENCOUNTER — Ambulatory Visit: Payer: Medicare Other | Admitting: Internal Medicine

## 2022-01-06 ENCOUNTER — Ambulatory Visit: Payer: Medicare Other | Admitting: Internal Medicine

## 2022-01-15 DIAGNOSIS — M7989 Other specified soft tissue disorders: Secondary | ICD-10-CM | POA: Diagnosis not present

## 2022-01-15 DIAGNOSIS — M19042 Primary osteoarthritis, left hand: Secondary | ICD-10-CM | POA: Diagnosis not present

## 2022-01-15 DIAGNOSIS — M19041 Primary osteoarthritis, right hand: Secondary | ICD-10-CM | POA: Diagnosis not present

## 2022-01-15 DIAGNOSIS — E1165 Type 2 diabetes mellitus with hyperglycemia: Secondary | ICD-10-CM | POA: Diagnosis not present

## 2022-01-15 DIAGNOSIS — Z299 Encounter for prophylactic measures, unspecified: Secondary | ICD-10-CM | POA: Diagnosis not present

## 2022-01-15 DIAGNOSIS — E1151 Type 2 diabetes mellitus with diabetic peripheral angiopathy without gangrene: Secondary | ICD-10-CM | POA: Diagnosis not present

## 2022-01-15 DIAGNOSIS — G4700 Insomnia, unspecified: Secondary | ICD-10-CM | POA: Diagnosis not present

## 2022-01-15 DIAGNOSIS — I10 Essential (primary) hypertension: Secondary | ICD-10-CM | POA: Diagnosis not present

## 2022-01-15 DIAGNOSIS — M79642 Pain in left hand: Secondary | ICD-10-CM | POA: Diagnosis not present

## 2022-01-19 DIAGNOSIS — E1165 Type 2 diabetes mellitus with hyperglycemia: Secondary | ICD-10-CM | POA: Diagnosis not present

## 2022-02-18 DIAGNOSIS — E1165 Type 2 diabetes mellitus with hyperglycemia: Secondary | ICD-10-CM | POA: Diagnosis not present

## 2022-03-03 DIAGNOSIS — R42 Dizziness and giddiness: Secondary | ICD-10-CM | POA: Diagnosis not present

## 2022-03-03 DIAGNOSIS — I10 Essential (primary) hypertension: Secondary | ICD-10-CM | POA: Diagnosis not present

## 2022-03-03 DIAGNOSIS — H8110 Benign paroxysmal vertigo, unspecified ear: Secondary | ICD-10-CM | POA: Diagnosis not present

## 2022-03-03 DIAGNOSIS — R110 Nausea: Secondary | ICD-10-CM | POA: Diagnosis not present

## 2022-03-03 DIAGNOSIS — I7090 Unspecified atherosclerosis: Secondary | ICD-10-CM | POA: Diagnosis not present

## 2022-03-03 DIAGNOSIS — I6523 Occlusion and stenosis of bilateral carotid arteries: Secondary | ICD-10-CM | POA: Diagnosis not present

## 2022-03-03 DIAGNOSIS — E7800 Pure hypercholesterolemia, unspecified: Secondary | ICD-10-CM | POA: Diagnosis not present

## 2022-03-03 DIAGNOSIS — E119 Type 2 diabetes mellitus without complications: Secondary | ICD-10-CM | POA: Diagnosis not present

## 2022-03-05 DIAGNOSIS — H9313 Tinnitus, bilateral: Secondary | ICD-10-CM | POA: Diagnosis not present

## 2022-03-05 DIAGNOSIS — H903 Sensorineural hearing loss, bilateral: Secondary | ICD-10-CM | POA: Diagnosis not present

## 2022-03-20 DIAGNOSIS — E1165 Type 2 diabetes mellitus with hyperglycemia: Secondary | ICD-10-CM | POA: Diagnosis not present

## 2022-04-20 DIAGNOSIS — E1165 Type 2 diabetes mellitus with hyperglycemia: Secondary | ICD-10-CM | POA: Diagnosis not present

## 2022-04-23 DIAGNOSIS — I509 Heart failure, unspecified: Secondary | ICD-10-CM | POA: Diagnosis not present

## 2022-04-23 DIAGNOSIS — I10 Essential (primary) hypertension: Secondary | ICD-10-CM | POA: Diagnosis not present

## 2022-04-23 DIAGNOSIS — E1165 Type 2 diabetes mellitus with hyperglycemia: Secondary | ICD-10-CM | POA: Diagnosis not present

## 2022-04-23 DIAGNOSIS — K219 Gastro-esophageal reflux disease without esophagitis: Secondary | ICD-10-CM | POA: Diagnosis not present

## 2022-04-23 DIAGNOSIS — J449 Chronic obstructive pulmonary disease, unspecified: Secondary | ICD-10-CM | POA: Diagnosis not present

## 2022-04-23 DIAGNOSIS — Z299 Encounter for prophylactic measures, unspecified: Secondary | ICD-10-CM | POA: Diagnosis not present

## 2022-04-23 DIAGNOSIS — I25118 Atherosclerotic heart disease of native coronary artery with other forms of angina pectoris: Secondary | ICD-10-CM | POA: Diagnosis not present

## 2022-05-20 DIAGNOSIS — E1165 Type 2 diabetes mellitus with hyperglycemia: Secondary | ICD-10-CM | POA: Diagnosis not present

## 2022-06-20 DIAGNOSIS — E1165 Type 2 diabetes mellitus with hyperglycemia: Secondary | ICD-10-CM | POA: Diagnosis not present

## 2022-07-21 DIAGNOSIS — E1165 Type 2 diabetes mellitus with hyperglycemia: Secondary | ICD-10-CM | POA: Diagnosis not present

## 2022-08-21 DIAGNOSIS — E1165 Type 2 diabetes mellitus with hyperglycemia: Secondary | ICD-10-CM | POA: Diagnosis not present

## 2022-09-20 DIAGNOSIS — E1165 Type 2 diabetes mellitus with hyperglycemia: Secondary | ICD-10-CM | POA: Diagnosis not present

## 2022-09-22 DIAGNOSIS — Z0000 Encounter for general adult medical examination without abnormal findings: Secondary | ICD-10-CM | POA: Diagnosis not present

## 2022-09-22 DIAGNOSIS — I10 Essential (primary) hypertension: Secondary | ICD-10-CM | POA: Diagnosis not present

## 2022-09-22 DIAGNOSIS — E119 Type 2 diabetes mellitus without complications: Secondary | ICD-10-CM | POA: Diagnosis not present

## 2022-09-22 DIAGNOSIS — D509 Iron deficiency anemia, unspecified: Secondary | ICD-10-CM | POA: Diagnosis not present

## 2022-09-22 DIAGNOSIS — Z7189 Other specified counseling: Secondary | ICD-10-CM | POA: Diagnosis not present

## 2022-09-22 DIAGNOSIS — I25118 Atherosclerotic heart disease of native coronary artery with other forms of angina pectoris: Secondary | ICD-10-CM | POA: Diagnosis not present

## 2022-09-22 DIAGNOSIS — Z299 Encounter for prophylactic measures, unspecified: Secondary | ICD-10-CM | POA: Diagnosis not present

## 2022-09-22 DIAGNOSIS — I509 Heart failure, unspecified: Secondary | ICD-10-CM | POA: Diagnosis not present

## 2022-10-06 ENCOUNTER — Ambulatory Visit: Payer: 59 | Admitting: Internal Medicine

## 2022-10-19 ENCOUNTER — Ambulatory Visit: Payer: 59 | Admitting: Gastroenterology

## 2022-10-21 DIAGNOSIS — E1165 Type 2 diabetes mellitus with hyperglycemia: Secondary | ICD-10-CM | POA: Diagnosis not present

## 2022-10-22 ENCOUNTER — Ambulatory Visit: Payer: 59 | Admitting: Gastroenterology

## 2022-10-22 ENCOUNTER — Ambulatory Visit (INDEPENDENT_AMBULATORY_CARE_PROVIDER_SITE_OTHER): Payer: 59 | Admitting: Gastroenterology

## 2022-10-22 ENCOUNTER — Encounter: Payer: Self-pay | Admitting: Gastroenterology

## 2022-10-22 VITALS — BP 121/65 | HR 88 | Temp 97.8°F | Ht 70.0 in | Wt 179.0 lb

## 2022-10-22 DIAGNOSIS — Z8719 Personal history of other diseases of the digestive system: Secondary | ICD-10-CM

## 2022-10-22 DIAGNOSIS — R1310 Dysphagia, unspecified: Secondary | ICD-10-CM | POA: Diagnosis not present

## 2022-10-22 NOTE — Progress Notes (Signed)
GI Office Note    Referring Provider: Kirstie Peri, MD Primary Care Physician:  Kirstie Peri, MD Primary Gastroenterologist: [name].Rourk, MD  Date:  10/22/2022  ID:  Lynelle Smoke, DOB March 14, 1956, MRN [id number]   Chief Complaint   Chief Complaint  Patient presents with   Follow-up    Pt have trouble swallowing   History of Present Illness  REMUS MOGA is a 67 y.o. male with a history of GERD, dysphagia, esophagitis, anxiety and depression, diabetes, HLD, HTN, COPD, CAD/MI in 2004 s/p stent placement x 2 presenting today with complaint of dysphagia.   EGD in 2020: -Fairly severe, extensive erosive reflux esophagitis -Gastric erosions -Maloney dilation performed -Mucosal changes in the distal esophagus suspicious for Barrett's -Gastric and esophageal biopsies taken -Gastric biopsies with chronic inactive gastritis, negative for H. pylori.  Esophageal biopsy with inflamed gastric mucosa -Advised to increase Nexium to 40 mg twice daily  Colonoscopy August 2021: -Inadequate prep -5 mm hyperplastic polyp in sigmoid colon  Last office visit March 2022.  Feeling of pressure above his clavicles when raising his head off the pillow.  Feels like bread hanging up in his throat.  Denies any constipation.  BM every day to every other day.  Rare straining.  Peanut butter can cause constipation.  Denies any abdominal pain or GI bleeding.  Advised Benefiber daily, continue Nexium twice daily.  Scheduled for colonoscopy in order BPE to further evaluate swallowing.   BP show stricture above GE junction and small epiphrenic diverticulum versus ulcer at distal esophagus just above GE junction.  Nonobstructive web at cervical esophagus.  Advised that he would benefit from EGD at time of colonoscopy.  Procedure was unable to be added therefore he was scheduled for a different day.  Colonoscopy May 2022: -Entire colon normal -Fair prep, copious lavage and irrigation needed  EGD June  2022: -Peptic esophageal stricture/ring s/p dilation and biopsy -Friable granular mucosa at stricture -Normal duodenum -Normal stomach -Continue Nexium 40 mg twice daily -Advise may benefit from short interval and subsequent dilation  Today:  Food feels like it is getting stuck in the back of his throat, sometimes in the middle of his neck.  Sometimes the liquid back up in his throat after he tries to wash it down and has to spit it up. Does not happen eveytime. Sometimes occurs with pills. Can do liquids without issue. Has felt choked at times. Has had issues with watermelon and chips -will have to regurgitate. Symptoms have not lasted several hours.  Has been compliant with taking his Nexium twice daily.  Denies any abdominal pain, nausea, vomiting, lack of appetite, early satiety, melena, or BRBPR.   Current Outpatient Medications  Medication Sig Dispense Refill   aspirin 81 MG tablet Take 81 mg by mouth daily.     canagliflozin (INVOKANA) 300 MG TABS tablet Take 300 mg by mouth daily before breakfast.     Cyanocobalamin 2500 MCG CHEW Chew 2,500 mcg by mouth daily.     DULoxetine (CYMBALTA) 30 MG capsule Take 30 mg by mouth daily.     esomeprazole (NEXIUM) 40 MG capsule TAKE 1 CAPSULE BY MOUTH TWICE A DAY (Patient taking differently: Take 40 mg by mouth 2 (two) times daily before a meal.) 180 capsule 1   gabapentin (NEURONTIN) 300 MG capsule Take 300 mg by mouth 3 (three) times daily as needed (Nerve pain).      lisinopril (PRINIVIL,ZESTRIL) 2.5 MG tablet Take 2.5 mg by mouth daily.  metFORMIN (GLUCOPHAGE) 500 MG tablet Take 500 mg by mouth 2 (two) times daily as needed (Diabetes).     metoprolol (TOPROL-XL) 50 MG 24 hr tablet TAKE ONE TABLET BY MOUTH EVERY DAY (Patient taking differently: Take 75 mg by mouth daily.) 30 tablet 6   rosuvastatin (CRESTOR) 20 MG tablet Take 20 mg by mouth at bedtime.      sitaGLIPtin (JANUVIA) 100 MG tablet Take 100 mg by mouth daily.     tamsulosin  (FLOMAX) 0.4 MG CAPS capsule TAKE 1 CAPSULE BY MOUTH EVERY DAY AFTER SUPPER 30 capsule 11   temazepam (RESTORIL) 30 MG capsule Take 30 mg by mouth at bedtime.     ibuprofen (ADVIL) 800 MG tablet Take 800 mg by mouth every 8 (eight) hours as needed for moderate pain. (Patient not taking: Reported on 10/22/2022)     nitroGLYCERIN (NITROSTAT) 0.4 MG SL tablet Place 1 tablet (0.4 mg total) under the tongue every 5 (five) minutes as needed. May repeat for up to 3 doses. (Patient not taking: Reported on 10/22/2022) 25 tablet 3   No current facility-administered medications for this visit.    Past Medical History:  Diagnosis Date   Anxiety    Bleeding disorder (HCC)    CAD (coronary artery disease), native coronary artery    S/P 2 stents in 2004   COPD (chronic obstructive pulmonary disease) (HCC)    Depression    Diabetes mellitus    Dyspnea    ED (erectile dysfunction)    On Levitra   GERD (gastroesophageal reflux disease)    High cholesterol    Hyperlipidemia, mixed    Hypertension, essential, benign    Myocardial infarction [facility]) 2004    Past Surgical History:  Procedure Laterality Date   BIOPSY  01/24/2019   Procedure: BIOPSY;  Surgeon: Corbin Ade, MD;  [facility];  Service: Endoscopy;;  gastric esophagus    BIOPSY  08/26/2020   Procedure: BIOPSY;  Surgeon: Corbin Ade, MD;  [facility]: [facility];  Service: Endoscopy;;   CATARACT EXTRACTION W/PHACO Right 05/09/2021   Procedure: CATARACT EXTRACTION PHACO AND INTRAOCULAR LENS PLACEMENT (IOC);  Surgeon: Fabio Pierce, MD;  Location: [facility];  Service: Ophthalmology;  Laterality: Right;  CDE: 6.97   CATARACT EXTRACTION W/PHACO Left 05/23/2021   Procedure: CATARACT EXTRACTION PHACO AND INTRAOCULAR LENS PLACEMENT (IOC);  Surgeon: Fabio Pierce, MD;  Location: [facility];  Service: Ophthalmology;  Laterality: Left;  CDE: 4.57   COLONOSCOPY WITH PROPOFOL N/A 11/20/2019   nadequate colon prep, one 5 mm polyp (hyperplastic) in  sigmoid colon   COLONOSCOPY WITH PROPOFOL N/A 07/29/2020   Procedure: COLONOSCOPY WITH PROPOFOL;  Surgeon: Corbin Ade, MD;  [facility];  Service: Endoscopy;  Laterality: N/A;  am appt, diabetic   ESOPHAGOGASTRODUODENOSCOPY     ESOPHAGOGASTRODUODENOSCOPY N/A 01/24/2019   severe, extensive erosive reflux esophagitis, gastric erosions, s/p dilation. Negative Barrett's.    ESOPHAGOGASTRODUODENOSCOPY (EGD) WITH PROPOFOL N/A 08/26/2020   Procedure: ESOPHAGOGASTRODUODENOSCOPY (EGD) WITH PROPOFOL;  Surgeon: Corbin Ade, MD;  [facility];  Service: Endoscopy;  Laterality: N/A;  8:30am   MALONEY DILATION N/A 01/24/2019   Procedure: Elease Hashimoto DILATION;  Surgeon: Corbin Ade, MD;  [facility];  Service: Endoscopy;  Laterality: N/A;   MALONEY DILATION N/A 08/26/2020   Procedure: Elease Hashimoto DILATION;  Surgeon: Corbin Ade, MD;  [facility];  Service: Endoscopy;  Laterality: N/A;   NO PAST SURGERIES  POLYPECTOMY  11/20/2019   Procedure: POLYPECTOMY;  Surgeon: Corbin Ade, MD;  [facility];  Service: Endoscopy;;   stent  2004   heart    Family History  Problem Relation Age of Onset   Heart disease Other    Throat cancer Paternal Grandfather    Heart attack Father    Dementia Mother    Colon cancer Neg Hx    Esophageal cancer Neg Hx     Allergies as of 10/22/2022   (No Known Allergies)    Social History   Socioeconomic History   Marital status: Single    Spouse name: Not on file   Number of children: Not on file   Years of education: Not on file   Highest education level: Not on file  Occupational History   Not on file  Tobacco Use   Smoking status: Former    Current packs/day: 0.00    Average packs/day: 2.0 packs/day for 30.0 years (60.0 ttl pk-yrs)    Types: Cigarettes    Start date: 03/20/1972    Quit date: 03/23/2002    Years since quitting: 20.5   Smokeless tobacco: Never  Vaping Use   Vaping status: Never  Used  Substance and Sexual Activity   Alcohol use: Yes    Alcohol/week: 0.0 standard drinks of alcohol    Comment: occ beer   Drug use: Never   Sexual activity: Yes  Other Topics Concern   Not on file  Social History Narrative   Single   Social Determinants of Health   Financial Resource Strain: Not on file  Food Insecurity: Not on file  Transportation Needs: Not on file  Physical Activity: Not on file  Stress: Not on file  Social Connections: Not on file     Review of Systems   Gen: Denies fever, chills, anorexia. Denies fatigue, weakness, weight loss.  CV: Denies chest pain, palpitations, syncope, peripheral edema, and claudication. Resp: Denies dyspnea at rest, cough, wheezing, coughing up blood, and pleurisy. GI: See HPI Derm: Denies rash, itching, dry skin Psych: Denies depression, anxiety, memory loss, confusion. No homicidal or suicidal ideation.  Heme: Denies bruising, bleeding, and enlarged lymph nodes.   Physical Exam   BP 121/65   Pulse 88   Temp 97.8 F (36.6 C)   Ht 5\' 10"  (1.778 m)   Wt 179 lb (81.2 kg)   BMI 25.68 kg/m   General:   Alert and oriented. No distress noted. Pleasant and cooperative.  Head:  Normocephalic and atraumatic. Eyes:  Conjuctiva clear without scleral icterus. Mouth:  Oral mucosa pink and moist. Good dentition. No lesions. Lungs:  Clear to auscultation bilaterally. No wheezes, rales, or rhonchi. No distress.  Heart:  S1, S2 present without murmurs appreciated.  Abdomen:  +BS, soft, non-tender and non-distended. No rebound or guarding. No HSM or masses noted. Rectal: deferred Msk:  Symmetrical without gross deformities. Normal posture. Extremities:  Without edema. Neurologic:  Alert and  oriented x4 Psych:  Alert and cooperative. Normal mood and affect.  Assessment  GRAVIEL LIVINGSTON is a 67 y.o. male with a history of GERD, dysphagia secondary to esophagitis and peptic strictures s/p multiple dilations, anxiety and depression,  diabetes, HLD, HTN, COPD, CAD/MI in 2004 s/p stent placement x 2 presenting today with complaint of dysphagia.  Dysphagia, History of peptic strictures and esophagitis: History of peptic strictures and erosive esophagitis requiring multiple dilations in the past.  Has been having more frequent symptoms recently with food  feeling like it is getting stuck at the back of his throat/middle of his neck.  Has had some occasional episodes of regurgitation and mild concern for food impaction.  Given his history is likely exhibiting symptoms secondary to another peptic stricture/ring, or web.  Given his prior need for dilations in the past we will go ahead and proceed with upper endoscopy with dilation given has been 2 years since his last one.  Discussed dysphagia/chewing precautions with him today and advised him continue PPI twice daily.  ED precautions discussed with them.   PLAN   Proceed with upper endoscopy plus dilation with propofol by Dr. Jena Gauss in near future: the risks, benefits, and alternatives have been discussed with the patient in detail. The patient states understanding and desires to proceed. ASA 3 (first of September - would also like to be notified if cancellation).  Continue Nexium 40 mg twice daily ED precautions discussed. Dysphagia precautions discussed Follow up in 2 months.     Brooke Bonito, MSN, FNP-BC, AGACNP-BC [facility]

## 2022-10-22 NOTE — Patient Instructions (Signed)
We will get you scheduled for an upper endoscopy with dilation in the near future with Dr. Jena Gauss.  Continue Nexium 40 mg twice daily.  When eating please take very small bites and alternate with sips of liquids.  Please ensure you chew food adequately prior to swallowing.  If symptoms become more frequent or on a daily basis and then there is concern for it being stuck and unable to wash down with liquids  or cough up please go to the ED immediately as you could have a food impaction.   We will plan to follow-up in about 2 months.  It was a pleasure to see you today. I want to create trusting relationships with patients. If you receive a survey regarding your visit,  I greatly appreciate you taking time to fill this out on paper or through your MyChart. I value your feedback.  Brooke Bonito, MSN, FNP-BC, AGACNP-BC [facility]

## 2022-10-30 ENCOUNTER — Telehealth: Payer: Self-pay | Admitting: *Deleted

## 2022-10-30 NOTE — Telephone Encounter (Signed)
LMOVM to call back to schedule EGD/ED with Dr. Jena Gauss, ASA 3. Needs to stop invokana 3 days prior, clears 24 hrs prior, no DM meds day of

## 2022-11-06 NOTE — Telephone Encounter (Signed)
Pt called back. Aware will call once we get oct schedule

## 2022-11-13 ENCOUNTER — Other Ambulatory Visit: Payer: Self-pay | Admitting: Urology

## 2022-11-17 NOTE — Telephone Encounter (Signed)
[facility] pharmacy is calling for patient for a refill on tamsulosin (FLOMAX) 0.4 MG CAPS capsule.   Please advise.

## 2022-11-21 DIAGNOSIS — E1165 Type 2 diabetes mellitus with hyperglycemia: Secondary | ICD-10-CM | POA: Diagnosis not present

## 2022-12-04 NOTE — Telephone Encounter (Addendum)
Spoke with pt. He has been scheduled for 10/2. Aware will call back with pre-op appt. Pt aware of medications to be held and when. Also discussed EGD instructions in details

## 2022-12-08 NOTE — Telephone Encounter (Signed)
LMOVM to call back to make aware of pre-op appt details 12/21/22 Monday at 11:30am

## 2022-12-08 NOTE — Telephone Encounter (Signed)
Pt called lmom for you to return his call.

## 2022-12-08 NOTE — Telephone Encounter (Signed)
Called pt and he is aware of pre-op appt details

## 2022-12-18 NOTE — Patient Instructions (Signed)
Ronnie Maynard  12/18/2022     @PREFPERIOPPHARMACY @   Your procedure is scheduled on 12/23/22.  Report to [facility] at 8:15 A.M.  Call this number if you have problems the morning of surgery:  [phone]  If you experience any cold or flu symptoms such as cough, fever, chills, shortness of breath, etc. between now and your scheduled surgery, please notify us at the above number.   Remember:   Please Follow the diet instructions given to you by your doctors office.    Take these medicines the morning of surgery with A SIP OF WATER : Cymbalta Nexium Neurontin and Metoprolol    Do not wear jewelry, make-up or nail polish, including gel polish,  artificial nails, or any other type of covering on natural nails (fingers and  toes).  Do not wear lotions, powders, or perfumes, or deodorant.  Do not shave 48 hours prior to surgery.  Men may shave face and neck.  Do not bring valuables to the hospital.  [facility] is not responsible for any belongings or valuables.  Contacts, dentures or bridgework may not be worn into surgery.  Leave your suitcase in the car.  After surgery it may be brought to your room.  For patients admitted to the hospital, discharge time will be determined by your treatment team.  Patients discharged the day of surgery will not be allowed to drive home.   Name and phone number of your driver:   Family Special instructions:  N/A  Please read over the following fact sheets that you were given. Care and Recovery After Surgery  Upper Endoscopy, Adult Upper endoscopy is a procedure to look inside the upper GI (gastrointestinal) tract. The upper GI tract is made up of: The esophagus. This is the part of the body that moves food from your mouth to your stomach. The stomach. The duodenum. This is the first part of your small intestine. This procedure is also called esophagogastroduodenoscopy (EGD) or gastroscopy. In this procedure, your health care provider passes  a thin, flexible tube (endoscope) through your mouth and down your esophagus into your stomach and into your duodenum. A small camera is attached to the end of the tube. Images from the camera appear on a monitor in the exam room. During this procedure, your health care provider may also remove a small piece of tissue to be sent to a lab and examined under a microscope (biopsy). Your health care provider may do an upper endoscopy to diagnose cancers of the upper GI tract. You may also have this procedure to find the cause of other conditions, such as: Stomach pain. Heartburn. Pain or problems when swallowing. Nausea and vomiting. Stomach bleeding. Stomach ulcers. Tell a health care provider about: Any allergies you have. All medicines you are taking, including vitamins, herbs, eye drops, creams, and over-the-counter medicines. Any problems you or family members have had with anesthetic medicines. Any bleeding problems you have. Any surgeries you have had. Any medical conditions you have. Whether you are pregnant or may be pregnant. What are the risks? Your healthcare provider will talk with you about risks. These may include: Infection. Bleeding. Allergic reactions to medicines. A tear or hole (perforation) in the esophagus, stomach, or duodenum. What happens before the procedure? When to stop eating and drinking Follow instructions from your health care provider about what you may eat and drink. These may include: 8 hours before your procedure Stop eating most foods. Do not eat meat, fried  foods, or fatty foods. Eat only light foods, such as toast or crackers. All liquids are okay except energy drinks and alcohol. 6 hours before your procedure Stop eating. Drink only clear liquids, such as water, clear fruit juice, black coffee, plain tea, and sports drinks. Do not drink energy drinks or alcohol. 2 hours before your procedure Stop drinking all liquids. You may be allowed to take  medicines with small sips of water. If you do not follow your health care provider's instructions, your procedure may be delayed or canceled. Medicines Ask your health care provider about: Changing or stopping your regular medicines. This is especially important if you are taking diabetes medicines or blood thinners. Taking medicines such as aspirin and ibuprofen. These medicines can thin your blood. Do not take these medicines unless your health care provider tells you to take them. Taking over-the-counter medicines, vitamins, herbs, and supplements. General instructions If you will be going home right after the procedure, plan to have a responsible adult: Take you home from the hospital or clinic. You will not be allowed to drive. Care for you for the time you are told. What happens during the procedure?  An IV will be inserted into one of your veins. You may be given one or more of the following: A medicine to help you relax (sedative). A medicine to numb the throat (local anesthetic). You will lie on your left side on an exam table. Your health care provider will pass the endoscope through your mouth and down your esophagus. Your health care provider will use the scope to check the inside of your esophagus, stomach, and duodenum. Biopsies may be taken. The endoscope will be removed. The procedure may vary among health care providers and hospitals. What happens after the procedure? Your blood pressure, heart rate, breathing rate, and blood oxygen level will be monitored until you leave the hospital or clinic. When your throat is no longer numb, you may be given some fluids to drink. If you were given a sedative during the procedure, it can affect you for several hours. Do not drive or operate machinery until your health care provider says that it is safe. It is up to you to get the results of your procedure. Ask your health care provider, or the department that is doing the procedure,  when your results will be ready. Contact a health care provider if you: Have a sore throat that lasts longer than 1 day. Have a fever. Get help right away if you: Vomit blood or your vomit looks like coffee grounds. Have bloody, black, or tarry stools. Have a very bad sore throat or you cannot swallow. Have difficulty breathing or very bad pain in your chest or abdomen. These symptoms may be an emergency. Get help right away. Call 911. Do not wait to see if the symptoms will go away. Do not drive yourself to the hospital. Summary Upper endoscopy is a procedure to look inside the upper GI tract. During the procedure, an IV will be inserted into one of your veins. You may be given a medicine to help you relax. The endoscope will be passed through your mouth and down your esophagus. Follow instructions from your health care provider about what you can eat and drink. This information is not intended to replace advice given to you by your health care provider. Make sure you discuss any questions you have with your health care provider. Document Revised: 06/18/2021 Document Reviewed: 06/18/2021 Elsevier Patient Education  2024 [employer]  Monitored Anesthesia Care Anesthesia refers to the techniques, procedures, and medicines that help a person stay safe and comfortable during surgery. Monitored anesthesia care, or sedation, is one type of anesthesia. You may have sedation if you do not need to be asleep for your procedure. Procedures that use sedation may include: Surgery to remove cataracts from your eyes. A dental procedure. A biopsy. This is when a tissue sample is removed and looked at under a microscope. You will be watched closely during your procedure. Your level of sedation or type of anesthesia may be changed to fit your needs. Tell a health care provider about: Any allergies you have. All medicines you are taking, including vitamins, herbs, eye drops, creams, and  over-the-counter medicines. Any problems you or family members have had with anesthesia. Any bleeding problems you have. Any surgeries you have had. Any medical conditions or illnesses you have. This includes sleep apnea, cough, fever, or the flu. Whether you are pregnant or may be pregnant. Whether you use cigarettes, alcohol, or drugs. Any use of steroids, whether by mouth or as a cream. What are the risks? Your health care provider will talk with you about risks. These may include: Getting too much medicine (oversedation). Nausea. Allergic reactions to medicines. Trouble breathing. If this happens, a breathing tube may be used to help you breathe. It will be removed when you are awake and breathing on your own. Heart trouble. Lung trouble. Confusion that gets better with time (emergence delirium). What happens before the procedure? When to stop eating and drinking Follow instructions from your health care provider about what you may eat and drink. These may include: 8 hours before your procedure Stop eating most foods. Do not eat meat, fried foods, or fatty foods. Eat only light foods, such as toast or crackers. All liquids are okay except energy drinks and alcohol. 6 hours before your procedure Stop eating. Drink only clear liquids, such as water, clear fruit juice, black coffee, plain tea, and sports drinks. Do not drink energy drinks or alcohol. 2 hours before your procedure Stop drinking all liquids. You may be allowed to take medicines with small sips of water. If you do not follow your health care provider's instructions, your procedure may be delayed or canceled. Medicines Ask your health care provider about: Changing or stopping your regular medicines. These include any diabetes medicines or blood thinners you take. Taking medicines such as aspirin and ibuprofen. These medicines can thin your blood. Do not take them unless your health care provider tells you to. Taking  over-the-counter medicines, vitamins, herbs, and supplements. Testing You may have an exam or testing. You may have a blood or urine sample taken. General instructions Do not use any products that contain nicotine or tobacco for at least 4 weeks before the procedure. These products include cigarettes, chewing tobacco, and vaping devices, such as e-cigarettes. If you need help quitting, ask your health care provider. If you will be going home right after the procedure, plan to have a responsible adult: Take you home from the hospital or clinic. You will not be allowed to drive. Care for you for the time you are told. What happens during the procedure?  Your blood pressure, heart rate, breathing, level of pain, and blood oxygen level will be monitored. An IV will be inserted into one of your veins. You may be given: A sedative. This helps you relax. Anesthesia. This will: Numb certain areas of your body. Make you fall asleep  for surgery. You will be given medicines as needed to keep you comfortable. The more medicine you are given, the deeper your level of sedation will be. Your level of sedation may be changed to fit your needs. There are three levels of sedation: Mild sedation. At this level, you may feel awake and relaxed. You will be able to follow directions. Moderate sedation. At this level, you will be sleepy. You may not remember the procedure. Deep sedation. At this level, you will be asleep. You will not remember the procedure. How you get the medicines will depend on your age and the procedure. They may be given as: A pill. This may be taken by mouth (orally) or inserted into the rectum. An injection. This may be into a vein or muscle. A spray through the nose. After your procedure is over, the medicine will be stopped. The procedure may vary among health care providers and hospitals. What happens after the procedure? Your blood pressure, heart rate, breathing rate, and blood  oxygen level will be monitored until you leave the hospital or clinic. You may feel sleepy, clumsy, or nauseous. You may not remember what happened during or after the procedure. Sedation can affect you for several hours. Do not drive or use machinery until your health care provider says that it is safe. This information is not intended to replace advice given to you by your health care provider. Make sure you discuss any questions you have with your health care provider. Document Revised: 08/04/2021 Document Reviewed: 08/04/2021 Elsevier Patient Education  2024 [employer].

## 2022-12-21 ENCOUNTER — Encounter (HOSPITAL_COMMUNITY)
Admission: RE | Admit: 2022-12-21 | Discharge: 2022-12-21 | Disposition: A | Discharge status: Home / Self Care | Payer: 59 | Source: Ambulatory Visit | Attending: Internal Medicine | Admitting: Internal Medicine

## 2022-12-21 ENCOUNTER — Encounter (HOSPITAL_COMMUNITY): Payer: Self-pay

## 2022-12-21 VITALS — BP 136/75 | HR 72 | Temp 97.8°F | Resp 18 | Ht 70.0 in | Wt 179.0 lb

## 2022-12-21 DIAGNOSIS — Z01812 Encounter for preprocedural laboratory examination: Secondary | ICD-10-CM | POA: Insufficient documentation

## 2022-12-21 DIAGNOSIS — E0800 Diabetes mellitus due to underlying condition with hyperosmolarity without nonketotic hyperglycemic-hyperosmolar coma (NKHHC): Secondary | ICD-10-CM | POA: Insufficient documentation

## 2022-12-21 DIAGNOSIS — E1165 Type 2 diabetes mellitus with hyperglycemia: Secondary | ICD-10-CM | POA: Diagnosis not present

## 2022-12-21 LAB — BASIC METABOLIC PANEL
Anion gap: 10 (ref 5–15)
BUN: 15 mg/dL (ref 8–23)
CO2: 21 mmol/L — ABNORMAL LOW (ref 22–32)
Calcium: 8.8 mg/dL — ABNORMAL LOW (ref 8.9–10.3)
Chloride: 104 mmol/L (ref 98–111)
Creatinine, Ser: 0.93 mg/dL (ref 0.61–1.24)
GFR, Estimated: >60 mL/min (ref >60)
Glucose, Bld: 162 mg/dL — ABNORMAL HIGH (ref 70–99)
Potassium: 4 mmol/L (ref 3.5–5.1)
Sodium: 135 mmol/L (ref 135–145)

## 2022-12-23 ENCOUNTER — Encounter (HOSPITAL_COMMUNITY)
Admission: RE | Disposition: A | Discharge status: Home / Self Care | Payer: Self-pay | Source: Home / Self Care | Attending: Internal Medicine

## 2022-12-23 ENCOUNTER — Encounter (HOSPITAL_COMMUNITY): Payer: Self-pay | Admitting: Internal Medicine

## 2022-12-23 ENCOUNTER — Ambulatory Visit (HOSPITAL_COMMUNITY): Payer: 59 | Admitting: Anesthesiology

## 2022-12-23 ENCOUNTER — Ambulatory Visit (HOSPITAL_COMMUNITY)
Admission: RE | Admit: 2022-12-23 | Discharge: 2022-12-23 | Disposition: A | Discharge status: Home / Self Care | Payer: 59 | Attending: Internal Medicine | Admitting: Internal Medicine

## 2022-12-23 DIAGNOSIS — E119 Type 2 diabetes mellitus without complications: Secondary | ICD-10-CM | POA: Diagnosis not present

## 2022-12-23 DIAGNOSIS — K222 Esophageal obstruction: Secondary | ICD-10-CM | POA: Diagnosis not present

## 2022-12-23 DIAGNOSIS — F32A Depression, unspecified: Secondary | ICD-10-CM | POA: Diagnosis not present

## 2022-12-23 DIAGNOSIS — R1310 Dysphagia, unspecified: Secondary | ICD-10-CM

## 2022-12-23 DIAGNOSIS — I252 Old myocardial infarction: Secondary | ICD-10-CM | POA: Diagnosis not present

## 2022-12-23 DIAGNOSIS — Z7984 Long term (current) use of oral hypoglycemic drugs: Secondary | ICD-10-CM | POA: Diagnosis not present

## 2022-12-23 DIAGNOSIS — J449 Chronic obstructive pulmonary disease, unspecified: Secondary | ICD-10-CM | POA: Insufficient documentation

## 2022-12-23 DIAGNOSIS — Z87891 Personal history of nicotine dependence: Secondary | ICD-10-CM | POA: Insufficient documentation

## 2022-12-23 DIAGNOSIS — I2510 Atherosclerotic heart disease of native coronary artery without angina pectoris: Secondary | ICD-10-CM | POA: Diagnosis not present

## 2022-12-23 DIAGNOSIS — K219 Gastro-esophageal reflux disease without esophagitis: Secondary | ICD-10-CM | POA: Insufficient documentation

## 2022-12-23 DIAGNOSIS — F419 Anxiety disorder, unspecified: Secondary | ICD-10-CM | POA: Diagnosis not present

## 2022-12-23 DIAGNOSIS — I10 Essential (primary) hypertension: Secondary | ICD-10-CM | POA: Insufficient documentation

## 2022-12-23 HISTORY — PX: ESOPHAGOGASTRODUODENOSCOPY (EGD) WITH PROPOFOL: SHX5813

## 2022-12-23 HISTORY — PX: MALONEY DILATION: SHX5535

## 2022-12-23 LAB — GLUCOSE, CAPILLARY: Glucose-Capillary: 161 mg/dL — ABNORMAL HIGH (ref 70–99)

## 2022-12-23 SURGERY — ESOPHAGOGASTRODUODENOSCOPY (EGD) WITH PROPOFOL
Anesthesia: Monitor Anesthesia Care

## 2022-12-23 MED ORDER — STERILE WATER FOR IRRIGATION IR SOLN
Status: DC | PRN
  Administered 2022-12-23: 60 mL

## 2022-12-23 MED ORDER — LIDOCAINE 2% (20 MG/ML) 5 ML SYRINGE
INTRAMUSCULAR | Status: DC | PRN
  Administered 2022-12-23: 100 mg via INTRAVENOUS

## 2022-12-23 MED ORDER — LIDOCAINE HCL (PF) 2 % IJ SOLN
INTRAMUSCULAR | Status: AC
  Filled 2022-12-23: qty 5

## 2022-12-23 MED ORDER — PROPOFOL 10 MG/ML IV BOLUS
INTRAVENOUS | Status: AC
  Filled 2022-12-23: qty 20

## 2022-12-23 MED ORDER — LACTATED RINGERS IV SOLN: INTRAVENOUS | Status: DC | PRN

## 2022-12-23 MED ORDER — PROPOFOL 10 MG/ML IV BOLUS
INTRAVENOUS | Status: DC | PRN
  Administered 2022-12-23: 75 mg via INTRAVENOUS
  Administered 2022-12-23: 50 mg via INTRAVENOUS
  Administered 2022-12-23: 25 mg via INTRAVENOUS

## 2022-12-23 NOTE — Anesthesia Preprocedure Evaluation (Deleted)
Anesthesia Evaluation  Patient identified by MRN, date of birth, ID band Patient awake    Reviewed: Allergy & Precautions, NPO status , Patient's Chart, lab work & pertinent test results, reviewed documented beta blocker date and time   Airway Mallampati: II  TM Distance: >3 FB Neck ROM: Full    Dental  (+) Edentulous Lower, Upper Dentures   Pulmonary shortness of breath and with exertion, COPD,  COPD inhaler, former smoker   Pulmonary exam normal breath sounds clear to auscultation       Cardiovascular Exercise Tolerance: Good hypertension, Pt. on medications and Pt. on home beta blockers + CAD and + Past MI  Normal cardiovascular exam Rhythm:Regular Rate:Normal  Sinus  Rhythm  -  Nonspecific T-abnormality   Neuro/Psych  PSYCHIATRIC DISORDERS Anxiety Depression    negative neurological ROS     GI/Hepatic Neg liver ROS,GERD  Medicated,,(+)       alcohol use  Endo/Other  diabetes, Well Controlled, Type 2, Oral Hypoglycemic Agents    Renal/GU negative Renal ROS  negative genitourinary   Musculoskeletal negative musculoskeletal ROS (+)    Abdominal   Peds negative pediatric ROS (+)  Hematology negative hematology ROS (+)   Anesthesia Other Findings   Reproductive/Obstetrics negative OB ROS                              Anesthesia Physical Anesthesia Plan  ASA: 3  Anesthesia Plan: MAC   Post-op Pain Management: Minimal or no pain anticipated   Induction:   PONV Risk Score and Plan: Treatment may vary due to age or medical condition  Airway Management Planned: Nasal Cannula and Natural Airway  Additional Equipment:   Intra-op Plan:   Post-operative Plan:   Informed Consent: I have reviewed the patients History and Physical, chart, labs and discussed the procedure including the risks, benefits and alternatives for the proposed anesthesia with the patient or authorized  representative who has indicated his/her understanding and acceptance.       Plan Discussed with: CRNA and Surgeon  Anesthesia Plan Comments:          Anesthesia Quick Evaluation                                  Anesthesia Evaluation  Patient identified by MRN, date of birth, ID band Patient awake    Reviewed: Allergy & Precautions, NPO status , Patient's Chart, lab work & pertinent test results, reviewed documented beta blocker date and time   Airway Mallampati: II  TM Distance: >3 FB Neck ROM: Full    Dental  (+) Edentulous Lower, Upper Dentures   Pulmonary shortness of breath and with exertion, COPD,  COPD inhaler, former smoker,    Pulmonary exam normal breath sounds clear to auscultation       Cardiovascular Exercise Tolerance: Good hypertension, Pt. on medications and Pt. on home beta blockers + CAD, + Past MI and + Cardiac Stents  Normal cardiovascular exam Rhythm:Regular Rate:Normal  Sinus  Rhythm  -  Nonspecific T-abnormality   Neuro/Psych PSYCHIATRIC DISORDERS Anxiety Depression    GI/Hepatic GERD  Medicated and Controlled,(+)     substance abuse  alcohol use,   Endo/Other  diabetes, Well Controlled, Type 2, Oral Hypoglycemic Agents  Renal/GU      Musculoskeletal   Abdominal   Peds  Hematology   Anesthesia Other Findings  Reproductive/Obstetrics                             Anesthesia Physical  Anesthesia Plan  ASA: 3  Anesthesia Plan: MAC   Post-op Pain Management: Minimal or no pain anticipated   Induction:   PONV Risk Score and Plan: Treatment may vary due to age or medical condition  Airway Management Planned: Nasal Cannula and Natural Airway  Additional Equipment:   Intra-op Plan:   Post-operative Plan:   Informed Consent: I have reviewed the patients History and Physical, chart, labs and discussed the procedure including the risks, benefits and alternatives for the proposed  anesthesia with the patient or authorized representative who has indicated his/her understanding and acceptance.       Plan Discussed with: CRNA and Surgeon  Anesthesia Plan Comments:         Anesthesia Quick Evaluation

## 2022-12-23 NOTE — Anesthesia Preprocedure Evaluation (Addendum)
Anesthesia Evaluation  Patient identified by MRN, date of birth, ID band Patient awake    Reviewed: Allergy & Precautions, NPO status , Patient's Chart, lab work & pertinent test results, reviewed documented beta blocker date and time   Airway Mallampati: II  TM Distance: >3 FB Neck ROM: Full    Dental  (+) Edentulous Lower, Upper Dentures, Dental Advisory Given   Pulmonary shortness of breath and with exertion, COPD,  COPD inhaler, former smoker   Pulmonary exam normal breath sounds clear to auscultation       Cardiovascular Exercise Tolerance: Good hypertension, Pt. on medications and Pt. on home beta blockers + CAD and + Past MI  Normal cardiovascular exam Rhythm:Regular Rate:Normal  Sinus  Rhythm  -  Nonspecific T-abnormality   Neuro/Psych  PSYCHIATRIC DISORDERS Anxiety Depression    negative neurological ROS     GI/Hepatic Neg liver ROS,GERD  Medicated,,(+)       alcohol use  Endo/Other  diabetes, Well Controlled, Type 2, Oral Hypoglycemic Agents    Renal/GU negative Renal ROS  negative genitourinary   Musculoskeletal negative musculoskeletal ROS (+)    Abdominal   Peds negative pediatric ROS (+)  Hematology negative hematology ROS (+)   Anesthesia Other Findings   Reproductive/Obstetrics negative OB ROS                             Anesthesia Physical Anesthesia Plan  ASA: 3  Anesthesia Plan: General   Post-op Pain Management: Minimal or no pain anticipated   Induction:   PONV Risk Score and Plan: Treatment may vary due to age or medical condition  Airway Management Planned: Nasal Cannula and Natural Airway  Additional Equipment: None  Intra-op Plan:   Post-operative Plan:   Informed Consent: I have reviewed the patients History and Physical, chart, labs and discussed the procedure including the risks, benefits and alternatives for the proposed anesthesia with the  patient or authorized representative who has indicated his/her understanding and acceptance.       Plan Discussed with: CRNA and Surgeon  Anesthesia Plan Comments:         Anesthesia Quick Evaluation                                  Anesthesia Evaluation  Patient identified by MRN, date of birth, ID band Patient awake    Reviewed: Allergy & Precautions, NPO status , Patient's Chart, lab work & pertinent test results, reviewed documented beta blocker date and time   Airway Mallampati: II  TM Distance: >3 FB Neck ROM: Full    Dental  (+) Edentulous Lower, Upper Dentures   Pulmonary shortness of breath and with exertion, COPD,  COPD inhaler, former smoker,    Pulmonary exam normal breath sounds clear to auscultation       Cardiovascular Exercise Tolerance: Good hypertension, Pt. on medications and Pt. on home beta blockers + CAD, + Past MI and + Cardiac Stents  Normal cardiovascular exam Rhythm:Regular Rate:Normal  Sinus  Rhythm  -  Nonspecific T-abnormality   Neuro/Psych PSYCHIATRIC DISORDERS Anxiety Depression    GI/Hepatic GERD  Medicated and Controlled,(+)     substance abuse  alcohol use,   Endo/Other  diabetes, Well Controlled, Type 2, Oral Hypoglycemic Agents  Renal/GU      Musculoskeletal   Abdominal   Peds  Hematology   Anesthesia Other Findings  Reproductive/Obstetrics                             Anesthesia Physical  Anesthesia Plan  ASA: 3  Anesthesia Plan: MAC   Post-op Pain Management: Minimal or no pain anticipated   Induction:   PONV Risk Score and Plan: Treatment may vary due to age or medical condition  Airway Management Planned: Nasal Cannula and Natural Airway  Additional Equipment:   Intra-op Plan:   Post-operative Plan:   Informed Consent: I have reviewed the patients History and Physical, chart, labs and discussed the procedure including the risks, benefits and alternatives  for the proposed anesthesia with the patient or authorized representative who has indicated his/her understanding and acceptance.       Plan Discussed with: CRNA and Surgeon  Anesthesia Plan Comments:         Anesthesia Quick Evaluation

## 2022-12-23 NOTE — Anesthesia Postprocedure Evaluation (Signed)
Anesthesia Post Note  Patient: Ronnie Maynard  Procedure(s) Performed: ESOPHAGOGASTRODUODENOSCOPY (EGD) WITH PROPOFOL MALONEY DILATION  Patient location during evaluation: PACU Anesthesia Type: MAC Level of consciousness: awake and alert Pain management: pain level controlled Vital Signs Assessment: post-procedure vital signs reviewed and stable Respiratory status: spontaneous breathing, nonlabored ventilation, respiratory function stable and patient connected to nasal cannula oxygen Cardiovascular status: blood pressure returned to baseline and stable Postop Assessment: no apparent nausea or vomiting Anesthetic complications: no   There were no known notable events for this encounter.   Last Vitals:  Vitals:   12/23/22 0900 12/23/22 1042  BP: 126/70 (!) 118/45  Pulse: 61   Resp: 10 15  Temp: (!) 36.4 C 36.5 C  SpO2: 96% 100%    Last Pain:  Vitals:   12/23/22 1042  TempSrc: Axillary  PainSc: 0-No pain                 Elanah Osmanovic L Markel Kurtenbach

## 2022-12-23 NOTE — Op Note (Signed)
[facility] Patient Name: Ronnie Maynard Procedure Date: 12/23/2022 10:02 AM MRN: [id number] Date of Birth: [birth year] Attending MD: Gennette Pac , MD, [id number] CSN: [id number] Age: 67 Admit Type: Outpatient Procedure:                Upper GI endoscopy Indications:              Dysphagia Providers:                Gennette Pac, MD, Edrick Kins, RN, Francoise Ceo RN, RN, Zena Amos Referring MD:             Gennette Pac, MD Medicines:                Propofol per Anesthesia Complications:            No immediate complications. Estimated Blood Loss:     Estimated blood loss was minimal. Procedure:                Pre-Anesthesia Assessment:                           - Prior to the procedure, a History and Physical                            was performed, and patient medications and                            allergies were reviewed. The patient's tolerance of                            previous anesthesia was also reviewed. The risks                            and benefits of the procedure and the sedation                            options and risks were discussed with the patient.                            All questions were answered, and informed consent                            was obtained. Prior Anticoagulants: The patient has                            taken no anticoagulant or antiplatelet agents. ASA                            Grade Assessment: III - A patient with severe                            systemic disease. After reviewing the risks and  benefits, the patient was deemed in satisfactory                            condition to undergo the procedure.                           After obtaining informed consent, the endoscope was                            passed under direct vision. Throughout the                            procedure, the patient's blood pressure, pulse, and                             oxygen saturations were monitored continuously. The                            GIF-H190 ([id number]) scope was introduced through the                            mouth, and advanced to the second part of duodenum.                            The upper GI endoscopy was accomplished without                            difficulty. The patient tolerated the procedure                            well. Scope In: 10:27:53 AM Scope Out: 10:35:20 AM Total Procedure Duration: 0 hours 7 minutes 27 seconds  Findings:      Short fibrous peptic stricture at the GE junction. Partially dilated       with scope passage. No mass. No Barrett's seen no active esophagitis.       Otherwise, tubular esophagus appeared normal.      The entire examined stomach was normal. Patent pylorus.      The duodenal bulb and second portion of the duodenum were normal. Scope       was withdrawn and a 54 [place] Maloney dilator was passed to full       insertion with mild to moderate resistance. A look back revealed that       the peptic stricture was nicely dilated there were 2 superficial tears       in the upper esophageal sphincter mucosa. There was superficial. Impression:               - Short peptic stricture GE junction status post                            dilation as scribed above                           - normal stomach.                           -  Normal duodenal bulb and second portion of the                            duodenum.                           - No specimens collected. Moderate Sedation:      Moderate (conscious) sedation was personally administered by an       anesthesia professional. The following parameters were monitored: oxygen       saturation, heart rate, blood pressure, respiratory rate, EKG, adequacy       of pulmonary ventilation, and response to care. Recommendation:           - Patient has a contact number available for                            emergencies. The signs and  symptoms of potential                            delayed complications were discussed with the                            patient. Return to normal activities tomorrow.                            Written discharge instructions were provided to the                            patient.                           - Advance diet as tolerated. Continue twice daily                            PPI (Nexium). Office visit with [place] in 6 months. Procedure Code(s):        --- Professional ---                           [phone], Esophagogastroduodenoscopy, flexible,                            transoral; diagnostic, including collection of                            specimen(s) by brushing or washing, when performed                            (separate procedure) Diagnosis Code(s):        --- Professional ---                           R13.10, Dysphagia, unspecified CPT copyright 2022 [employer] Medical Association. All rights reserved. The codes documented in this report are preliminary and upon coder review may  be revised to meet current compliance requirements. Ronnie Maynard. Ronnie Seppala, MD Gennette Pac, MD 12/23/2022 10:41:51 AM This report has been signed electronically. Number  of Addenda: 0

## 2022-12-23 NOTE — Discharge Instructions (Signed)
EGD Discharge instructions Please read the instructions outlined below and refer to this sheet in the next few weeks. These discharge instructions provide you with general information on caring for yourself after you leave the hospital. Your doctor may also give you specific instructions. While your treatment has been planned according to the most current medical practices available, unavoidable complications occasionally occur. If you have any problems or questions after discharge, please call your doctor. ACTIVITY You may resume your regular activity but move at a slower pace for the next 24 hours.  Take frequent rest periods for the next 24 hours.  Walking will help expel (get rid of) the air and reduce the bloated feeling in your abdomen.  No driving for 24 hours (because of the anesthesia (medicine) used during the test).  You may shower.  Do not sign any important legal documents or operate any machinery for 24 hours (because of the anesthesia used during the test).  NUTRITION Drink plenty of fluids.  You may resume your normal diet.  Begin with a light meal and progress to your normal diet.  Avoid alcoholic beverages for 24 hours or as instructed by your caregiver.  MEDICATIONS You may resume your normal medications unless your caregiver tells you otherwise.  WHAT YOU CAN EXPECT TODAY You may experience abdominal discomfort such as a feeling of fullness or "gas" pains.  FOLLOW-UP Your doctor will discuss the results of your test with you.  SEEK IMMEDIATE MEDICAL ATTENTION IF ANY OF THE FOLLOWING OCCUR: Excessive nausea (feeling sick to your stomach) and/or vomiting.  Severe abdominal pain and distention (swelling).  Trouble swallowing.  Temperature over 101 F (37.8 C).  Rectal bleeding or vomiting of blood.       Your the stricture in your esophagus has tightened up again.  Your esophagus was  stretched.    Continue taking Nexium twice daily-best taken taken before breakfast  and before supper   office visit with [place] in 6 months

## 2022-12-23 NOTE — Transfer of Care (Signed)
Immediate Anesthesia Transfer of Care Note  Patient: Ronnie Maynard  Procedure(s) Performed: ESOPHAGOGASTRODUODENOSCOPY (EGD) WITH PROPOFOL MALONEY DILATION  Patient Location: PACU  Anesthesia Type:General  Level of Consciousness: awake, alert , and oriented  Airway & Oxygen Therapy: Patient Spontanous Breathing  Post-op Assessment: Report given to RN and Post -op Vital signs reviewed and stable  Post vital signs: Reviewed and stable  Last Vitals:  Vitals Value Taken Time  BP    Temp    Pulse    Resp    SpO2      Last Pain:  Vitals:   12/23/22 1022  TempSrc:   PainSc: 0-No pain         Complications: No notable events documented.

## 2022-12-23 NOTE — H&P (Signed)
@LOGO @   Primary Care Physician:  Kirstie Peri, MD Primary Gastroenterologist:  Dr. Jena Gauss  Pre-Procedure History & Physical: HPI:  Ronnie Maynard is a 67 y.o. male here for  further evaluation of known peptic stricture in the setting of recurrent dysphagia.  For EGD with esophageal dilation is feasible/appropriate per plan.    Stricture found in 2022 responded nicely to passage of a 54 [place] Maloney dilator until recurrent symptoms recently.  Patient states GERD well-controlled.  Past Medical History:  Diagnosis Date   Anxiety    Bleeding disorder (HCC)    CAD (coronary artery disease), native coronary artery    S/P 2 stents in 2004   COPD (chronic obstructive pulmonary disease) (HCC)    Depression    Diabetes mellitus    Dyspnea    ED (erectile dysfunction)    On Levitra   GERD (gastroesophageal reflux disease)    High cholesterol    Hyperlipidemia, mixed    Hypertension, essential, benign    Myocardial infarction [facility]) 2004    Past Surgical History:  Procedure Laterality Date   BIOPSY  01/24/2019   Procedure: BIOPSY;  Surgeon: Corbin Ade, MD;  [facility];  Service: Endoscopy;;  gastric esophagus    BIOPSY  08/26/2020   Procedure: BIOPSY;  Surgeon: Corbin Ade, MD;  [facility];  Service: Endoscopy;;   CARDIAC CATHETERIZATION  2004   CATARACT EXTRACTION W/PHACO Right 05/09/2021   Procedure: CATARACT EXTRACTION PHACO AND INTRAOCULAR LENS PLACEMENT (IOC);  Surgeon: Fabio Pierce, MD;  Location: [facility];  Service: Ophthalmology;  Laterality: Right;  CDE: 6.97   CATARACT EXTRACTION W/PHACO Left 05/23/2021   Procedure: CATARACT EXTRACTION PHACO AND INTRAOCULAR LENS PLACEMENT (IOC);  Surgeon: Fabio Pierce, MD;  Location: [facility];  Service: Ophthalmology;  Laterality: Left;  CDE: 4.57   COLONOSCOPY WITH PROPOFOL N/A 11/20/2019   nadequate colon prep, one 5 mm polyp (hyperplastic) in sigmoid colon   COLONOSCOPY WITH PROPOFOL N/A 07/29/2020    Procedure: COLONOSCOPY WITH PROPOFOL;  Surgeon: Corbin Ade, MD;  [facility];  Service: Endoscopy;  Laterality: N/A;  am appt, diabetic   ESOPHAGOGASTRODUODENOSCOPY     ESOPHAGOGASTRODUODENOSCOPY N/A 01/24/2019   severe, extensive erosive reflux esophagitis, gastric erosions, s/p dilation. Negative Barrett's.    ESOPHAGOGASTRODUODENOSCOPY (EGD) WITH PROPOFOL N/A 08/26/2020   Procedure: ESOPHAGOGASTRODUODENOSCOPY (EGD) WITH PROPOFOL;  Surgeon: Corbin Ade, MD;  [facility];  Service: Endoscopy;  Laterality: N/A;  8:30am   MALONEY DILATION N/A 01/24/2019   Procedure: Elease Hashimoto DILATION;  Surgeon: Corbin Ade, MD;  [facility];  Service: Endoscopy;  Laterality: N/A;   MALONEY DILATION N/A 08/26/2020   Procedure: Elease Hashimoto DILATION;  Surgeon: Corbin Ade, MD;  [facility];  Service: Endoscopy;  Laterality: N/A;   POLYPECTOMY  11/20/2019   Procedure: POLYPECTOMY;  Surgeon: Corbin Ade, MD;  [facility];  Service: Endoscopy;;   stent  2004   heart    Prior to Admission medications   Medication Sig Start Date End Date Taking? Authorizing Provider  aspirin 81 MG tablet Take 81 mg by mouth daily.   Yes [provider]  canagliflozin (INVOKANA) 300 MG TABS tablet Take 300 mg by mouth daily before breakfast.   Yes [provider]  Cyanocobalamin 2500 MCG CHEW Chew 2,500 mcg by mouth daily.   Yes [provider]  DULoxetine (CYMBALTA) 30 MG capsule Take 30 mg by mouth daily.  Yes [provider]  empagliflozin (JARDIANCE) 10 MG TABS tablet Take 10 mg by mouth daily.   Yes [provider]  gabapentin (NEURONTIN) 300 MG capsule Take 300 mg by mouth 3 (three) times daily as needed (Nerve pain).    Yes [provider]  lisinopril (PRINIVIL,ZESTRIL) 2.5 MG tablet Take 2.5 mg by mouth daily.   Yes [provider]  metFORMIN (GLUCOPHAGE) 500 MG tablet Take 500 mg by mouth  2 (two) times daily as needed (Diabetes).   Yes [provider]  rosuvastatin (CRESTOR) 20 MG tablet Take 20 mg by mouth at bedtime.    Yes [provider]  sitaGLIPtin (JANUVIA) 100 MG tablet Take 100 mg by mouth daily.   Yes [provider]  tamsulosin (FLOMAX) 0.4 MG CAPS capsule TAKE 1 CAPSULE BY MOUTH EVERY DAY AFTER SUPPER 11/19/22  Yes McKenzie, Mardene Celeste, MD  temazepam (RESTORIL) 30 MG capsule Take 30 mg by mouth at bedtime.   Yes [provider]  esomeprazole (NEXIUM) 40 MG capsule TAKE 1 CAPSULE BY MOUTH TWICE A DAY Patient taking differently: Take 40 mg by mouth 2 (two) times daily before a meal. 01/16/20   Tiffany Kocher, PA-C  ibuprofen (ADVIL) 800 MG tablet Take 800 mg by mouth every 8 (eight) hours as needed for moderate pain. Patient not taking: Reported on 10/22/2022    [provider]  metoprolol (TOPROL-XL) 50 MG 24 hr tablet TAKE ONE TABLET BY MOUTH EVERY DAY Patient taking differently: Take 75 mg by mouth daily. 09/17/10   de Melanie Crazier, MD  nitroGLYCERIN (NITROSTAT) 0.4 MG SL tablet Place 1 tablet (0.4 mg total) under the tongue every 5 (five) minutes as needed. May repeat for up to 3 doses. Patient not taking: Reported on 10/22/2022 03/07/14   Antoine Poche, MD    Allergies as of 12/04/2022   (No Known Allergies)    Family History  Problem Relation Age of Onset   Heart disease Other    Throat cancer Paternal Grandfather    Heart attack Father    Dementia Mother    Colon cancer Neg Hx    Esophageal cancer Neg Hx     Social History   Socioeconomic History   Marital status: Single    Spouse name: Not on file   Number of children: Not on file   Years of education: Not on file   Highest education level: Not on file  Occupational History   Not on file  Tobacco Use   Smoking status: Former    Current packs/day: 0.00    Average packs/day: 2.0 packs/day for 30.0 years (60.0 ttl pk-yrs)    Types: Cigarettes     Start date: 03/20/1972    Quit date: 03/23/2002    Years since quitting: 20.7   Smokeless tobacco: Never  Vaping Use   Vaping status: Never Used  Substance and Sexual Activity   Alcohol use: Yes    Alcohol/week: 0.0 standard drinks of alcohol    Comment: occ beer   Drug use: Never   Sexual activity: Yes  Other Topics Concern   Not on file  Social History Narrative   Single   Social Determinants of Health   Financial Resource Strain: Not on file  Food Insecurity: Not on file  Transportation Needs: Not on file  Physical Activity: Not on file  Stress: Not on file  Social Connections: Not on file  Intimate Partner Violence: Not on file    Review  of Systems: See HPI, otherwise negative ROS  Physical Exam: BP 126/70   Pulse 61   Temp (!) 97.5 F (36.4 C) (Oral)   Resp 10   Ht 5\' 10"  (1.778 m)   Wt 81.2 kg   SpO2 96%   BMI 25.69 kg/m  General:   Alert,  Well-developed, well-nourished, pleasant and cooperative in NAD Abdomen: Non-distended, normal bowel sounds.  Soft and nontender without appreciable mass or hepatosplenomegaly.  Pulses:  Normal pulses noted. Extremities:  Without clubbing or edema.  Impression/Plan:    67 year old gentleman with longstanding GERD with peptic stricture recurrent esophageal dysphagia.  Here for EGD with esophageal dilation is feasible/appropriate.  The risks, benefits, limitations, alternatives and imponderables have been reviewed with the patient. Potential for esophageal dilation, biopsy, etc. have also been reviewed.  Questions have been answered. All parties agreeable.      Notice: This dictation was prepared with Dragon dictation along with smaller phrase technology. Any transcriptional errors that result from this process are unintentional and may not be corrected upon review.

## 2022-12-29 DIAGNOSIS — I110 Hypertensive heart disease with heart failure: Secondary | ICD-10-CM | POA: Diagnosis not present

## 2022-12-29 DIAGNOSIS — E1151 Type 2 diabetes mellitus with diabetic peripheral angiopathy without gangrene: Secondary | ICD-10-CM | POA: Diagnosis not present

## 2022-12-29 DIAGNOSIS — Z23 Encounter for immunization: Secondary | ICD-10-CM | POA: Diagnosis not present

## 2022-12-29 DIAGNOSIS — Z299 Encounter for prophylactic measures, unspecified: Secondary | ICD-10-CM | POA: Diagnosis not present

## 2022-12-29 DIAGNOSIS — E1165 Type 2 diabetes mellitus with hyperglycemia: Secondary | ICD-10-CM | POA: Diagnosis not present

## 2022-12-29 DIAGNOSIS — I10 Essential (primary) hypertension: Secondary | ICD-10-CM | POA: Diagnosis not present

## 2022-12-29 DIAGNOSIS — M25562 Pain in left knee: Secondary | ICD-10-CM | POA: Diagnosis not present

## 2023-01-01 ENCOUNTER — Encounter (HOSPITAL_COMMUNITY): Payer: Self-pay | Admitting: Internal Medicine

## 2023-01-06 DIAGNOSIS — H35033 Hypertensive retinopathy, bilateral: Secondary | ICD-10-CM | POA: Diagnosis not present

## 2023-01-19 ENCOUNTER — Other Ambulatory Visit: Payer: Self-pay | Admitting: Gastroenterology

## 2023-01-19 ENCOUNTER — Telehealth: Payer: Self-pay | Admitting: *Deleted

## 2023-01-19 MED ORDER — ESOMEPRAZOLE MAGNESIUM 40 MG PO CPDR: 40.0000 mg | DELAYED_RELEASE_CAPSULE | Freq: Two times a day (BID) | ORAL | 2 refills | Status: DC

## 2023-01-19 NOTE — Progress Notes (Signed)
Done

## 2023-01-19 NOTE — Telephone Encounter (Signed)
Pt called and needs a prescriptions for Nexium twice daily sent to [facility] pharmacy. Last OV 10/22/2022

## 2023-01-20 DIAGNOSIS — E1165 Type 2 diabetes mellitus with hyperglycemia: Secondary | ICD-10-CM | POA: Diagnosis not present

## 2023-02-19 DIAGNOSIS — E1165 Type 2 diabetes mellitus with hyperglycemia: Secondary | ICD-10-CM | POA: Diagnosis not present

## 2023-03-22 DIAGNOSIS — E1165 Type 2 diabetes mellitus with hyperglycemia: Secondary | ICD-10-CM | POA: Diagnosis not present

## 2023-04-21 DIAGNOSIS — E1165 Type 2 diabetes mellitus with hyperglycemia: Secondary | ICD-10-CM | POA: Diagnosis not present

## 2023-04-28 DIAGNOSIS — E1151 Type 2 diabetes mellitus with diabetic peripheral angiopathy without gangrene: Secondary | ICD-10-CM | POA: Diagnosis not present

## 2023-04-28 DIAGNOSIS — I10 Essential (primary) hypertension: Secondary | ICD-10-CM | POA: Diagnosis not present

## 2023-04-28 DIAGNOSIS — Z299 Encounter for prophylactic measures, unspecified: Secondary | ICD-10-CM | POA: Diagnosis not present

## 2023-04-28 DIAGNOSIS — E1169 Type 2 diabetes mellitus with other specified complication: Secondary | ICD-10-CM | POA: Diagnosis not present

## 2023-04-28 DIAGNOSIS — I509 Heart failure, unspecified: Secondary | ICD-10-CM | POA: Diagnosis not present

## 2023-04-28 DIAGNOSIS — M25562 Pain in left knee: Secondary | ICD-10-CM | POA: Diagnosis not present

## 2023-05-21 DIAGNOSIS — E1165 Type 2 diabetes mellitus with hyperglycemia: Secondary | ICD-10-CM | POA: Diagnosis not present

## 2023-06-18 ENCOUNTER — Encounter: Payer: Self-pay | Admitting: Internal Medicine

## 2023-06-20 DIAGNOSIS — E1165 Type 2 diabetes mellitus with hyperglycemia: Secondary | ICD-10-CM | POA: Diagnosis not present

## 2023-07-20 ENCOUNTER — Ambulatory Visit (INDEPENDENT_AMBULATORY_CARE_PROVIDER_SITE_OTHER): Admitting: Internal Medicine

## 2023-07-20 VITALS — BP 132/75 | HR 69

## 2023-07-20 DIAGNOSIS — K219 Gastro-esophageal reflux disease without esophagitis: Secondary | ICD-10-CM | POA: Diagnosis not present

## 2023-07-20 DIAGNOSIS — Z8719 Personal history of other diseases of the digestive system: Secondary | ICD-10-CM | POA: Diagnosis not present

## 2023-07-20 DIAGNOSIS — R140 Abdominal distension (gaseous): Secondary | ICD-10-CM

## 2023-07-20 DIAGNOSIS — K9289 Other specified diseases of the digestive system: Secondary | ICD-10-CM

## 2023-07-20 DIAGNOSIS — E739 Lactose intolerance, unspecified: Secondary | ICD-10-CM

## 2023-07-20 DIAGNOSIS — R1310 Dysphagia, unspecified: Secondary | ICD-10-CM

## 2023-07-20 NOTE — Patient Instructions (Signed)
 It was good to see you again today!  Continue omeprazole 40 mg once daily 30 minutes before breakfast (dispense 93 refills)  May try align-a probiotic for gas bloat symptoms  May try Lactaid or other lactase supplement for dairy intolerance  Office visit in 1 year  1 more screening colonoscopy 2032

## 2023-07-20 NOTE — Progress Notes (Signed)
 Primary Care Physician:  Theoplis Fix, MD Primary Gastroenterologist:  Dr. Riley Cheadle  Pre-Procedure History & Physical: HPI:  Ronnie Maynard is a 68 y.o. male here for follow-up peptic stricture dilated 6 months ago.  Dysphagia has resolved.  On omeprazole.  Reflux well-controlled  -  does note gas bloat symptoms worse with dairy products.  Negative colonoscopy 2022.  Overall, states he is doing much better.  Past Medical History:  Diagnosis Date   Anxiety    Bleeding disorder (HCC)    CAD (coronary artery disease), native coronary artery    S/P 2 stents in 2004   COPD (chronic obstructive pulmonary disease) (HCC)    Depression    Diabetes mellitus    Dyspnea    ED (erectile dysfunction)    On Levitra   GERD (gastroesophageal reflux disease)    High cholesterol    Hyperlipidemia, mixed    Hypertension, essential, benign    Myocardial infarction [facility]) 2004    Past Surgical History:  Procedure Laterality Date   BIOPSY  01/24/2019   Procedure: BIOPSY;  Surgeon: Suzette Espy, MD;  [facility];  Service: Endoscopy;;  gastric esophagus    BIOPSY  08/26/2020   Procedure: BIOPSY;  Surgeon: Suzette Espy, MD;  [facility];  Service: Endoscopy;;   CARDIAC CATHETERIZATION  2004   CATARACT EXTRACTION W/PHACO Right 05/09/2021   Procedure: CATARACT EXTRACTION PHACO AND INTRAOCULAR LENS PLACEMENT (IOC);  Surgeon: Tarri Farm, MD;  Location: [facility];  Service: Ophthalmology;  Laterality: Right;  CDE: 6.97   CATARACT EXTRACTION W/PHACO Left 05/23/2021   Procedure: CATARACT EXTRACTION PHACO AND INTRAOCULAR LENS PLACEMENT (IOC);  Surgeon: Tarri Farm, MD;  Location: [facility];  Service: Ophthalmology;  Laterality: Left;  CDE: 4.57   COLONOSCOPY WITH PROPOFOL  N/A 11/20/2019   nadequate colon prep, one 5 mm polyp (hyperplastic) in sigmoid colon   COLONOSCOPY WITH PROPOFOL  N/A 07/29/2020   Procedure: COLONOSCOPY WITH PROPOFOL ;  Surgeon: Suzette Espy, MD;   [facility];  Service: Endoscopy;  Laterality: N/A;  am appt, diabetic   ESOPHAGOGASTRODUODENOSCOPY     ESOPHAGOGASTRODUODENOSCOPY N/A 01/24/2019   severe, extensive erosive reflux esophagitis, gastric erosions, s/p dilation. Negative Barrett's.    ESOPHAGOGASTRODUODENOSCOPY (EGD) WITH PROPOFOL  N/A 08/26/2020   Procedure: ESOPHAGOGASTRODUODENOSCOPY (EGD) WITH PROPOFOL ;  Surgeon: Suzette Espy, MD;  [facility];  Service: Endoscopy;  Laterality: N/A;  8:30am   ESOPHAGOGASTRODUODENOSCOPY (EGD) WITH PROPOFOL  N/A 12/23/2022   Procedure: ESOPHAGOGASTRODUODENOSCOPY (EGD) WITH PROPOFOL ;  Surgeon: Suzette Espy, MD;  [facility];  Service: Endoscopy;  Laterality: N/A;  10:15am, asa 3   MALONEY DILATION N/A 01/24/2019   Procedure: MALONEY DILATION;  Surgeon: Suzette Espy, MD;  [facility];  Service: Endoscopy;  Laterality: N/A;   MALONEY DILATION N/A 08/26/2020   Procedure: Londa Rival DILATION;  Surgeon: Suzette Espy, MD;  [facility];  Service: Endoscopy;  Laterality: N/A;   MALONEY DILATION N/A 12/23/2022   Procedure: Londa Rival DILATION;  Surgeon: Suzette Espy, MD;  [facility];  Service: Endoscopy;  Laterality: N/A;   POLYPECTOMY  11/20/2019   Procedure: POLYPECTOMY;  Surgeon: Suzette Espy, MD;  [facility];  Service: Endoscopy;;   stent  2004   heart    Prior to Admission medications   Medication Sig Start Date End Date Taking? Authorizing Provider  aspirin 81 MG tablet Take 81 mg by mouth daily.  Yes [provider]  Cyanocobalamin 2500 MCG CHEW Chew 2,500 mcg by mouth daily.   Yes [provider]  DULoxetine (CYMBALTA) 30 MG capsule Take 30 mg by mouth daily.   Yes [provider]  gabapentin (NEURONTIN) 300 MG capsule Take 300 mg by mouth 3 (three) times daily as needed (Nerve pain).    Yes [provider]  lisinopril (PRINIVIL,ZESTRIL) 2.5 MG tablet Take 2.5 mg by  mouth daily.   Yes [provider]  metFORMIN (GLUCOPHAGE) 500 MG tablet Take 500 mg by mouth 2 (two) times daily as needed (Diabetes).   Yes [provider]  nitroGLYCERIN  (NITROSTAT ) 0.4 MG SL tablet Place 1 tablet (0.4 mg total) under the tongue every 5 (five) minutes as needed. May repeat for up to 3 doses. 03/07/14  Yes BranchJoyceann No, MD  omeprazole (PRILOSEC) 40 MG capsule Take 40 mg by mouth daily. 04/26/23  Yes [provider]  rosuvastatin (CRESTOR) 20 MG tablet Take 20 mg by mouth at bedtime.    Yes [provider]  sitaGLIPtin (JANUVIA) 100 MG tablet Take 100 mg by mouth daily.   Yes [provider]  tamsulosin  (FLOMAX ) 0.4 MG CAPS capsule TAKE 1 CAPSULE BY MOUTH EVERY DAY AFTER SUPPER 11/19/22  Yes McKenzie, Arden Beck, MD  temazepam (RESTORIL) 30 MG capsule Take 30 mg by mouth at bedtime.   Yes [provider]    Allergies as of 07/20/2023   (No Known Allergies)    Family History  Problem Relation Age of Onset   Heart disease Other    Throat cancer Paternal Grandfather    Heart attack Father    Dementia Mother    Colon cancer Neg Hx    Esophageal cancer Neg Hx     Social History   Socioeconomic History   Marital status: Single    Spouse name: Not on file   Number of children: Not on file   Years of education: Not on file   Highest education level: Not on file  Occupational History   Not on file  Tobacco Use   Smoking status: Former    Current packs/day: 0.00    Average packs/day: 2.0 packs/day for 30.0 years (60.0 ttl pk-yrs)    Types: Cigarettes    Start date: 03/20/1972    Quit date: 03/23/2002    Years since quitting: 21.3   Smokeless tobacco: Never  Vaping Use   Vaping status: Never Used  Substance and Sexual Activity   Alcohol use: Yes    Alcohol/week: 0.0 standard drinks of alcohol    Comment: occ beer   Drug use: Never   Sexual activity: Yes  Other Topics Concern   Not on file  Social History  Narrative   Single   Social Drivers of Health   Financial Resource Strain: Not on file  Food Insecurity: Not on file  Transportation Needs: Not on file  Physical Activity: Not on file  Stress: Not on file  Social Connections: Not on file  Intimate Partner Violence: Not on file    Review of Systems: See HPI, otherwise negative ROS  Physical Exam: BP 132/75 (BP Location: Right Arm, Patient Position: Sitting, Cuff Size: Normal)   Pulse 69  General:   Alert,  Well-developed, well-nourished, pleasant and cooperative in NAD Abdomen: Non-distended, normal bowel sounds.  Soft and nontender without appreciable mass or hepatosplenomegaly.   Impression/Plan: 68 year old gentleman with a peptic stricture dilated last year excellent results after Maloney dilation.  Reflux  extremely well-controlled on omeprazole.  He has some gas bloat symptoms from time to time worse with dairy. Likely, has an element of lack to his intolerance.  He is up-to-date on colorectal cancer screening via colonoscopy   Recommendations:  Continue omeprazole 40 mg once daily 30 minutes before breakfast (dispense 93 refills)  May try align-a probiotic for gas bloat symptoms.  Take daily  May try Lactaid or other lactase supplement for dairy intolerance  Office visit in 1 year  1 more screening colonoscopy 2032    Notice: This dictation was prepared with Dragon dictation along with smaller phrase technology. Any transcriptional errors that result from this process are unintentional and may not be corrected upon review.

## 2023-07-21 DIAGNOSIS — E1165 Type 2 diabetes mellitus with hyperglycemia: Secondary | ICD-10-CM | POA: Diagnosis not present

## 2023-08-05 DIAGNOSIS — J069 Acute upper respiratory infection, unspecified: Secondary | ICD-10-CM | POA: Diagnosis not present

## 2023-08-05 DIAGNOSIS — E1169 Type 2 diabetes mellitus with other specified complication: Secondary | ICD-10-CM | POA: Diagnosis not present

## 2023-08-05 DIAGNOSIS — R058 Other specified cough: Secondary | ICD-10-CM | POA: Diagnosis not present

## 2023-08-05 DIAGNOSIS — I509 Heart failure, unspecified: Secondary | ICD-10-CM | POA: Diagnosis not present

## 2023-08-05 DIAGNOSIS — Z299 Encounter for prophylactic measures, unspecified: Secondary | ICD-10-CM | POA: Diagnosis not present

## 2023-08-05 DIAGNOSIS — E1151 Type 2 diabetes mellitus with diabetic peripheral angiopathy without gangrene: Secondary | ICD-10-CM | POA: Diagnosis not present

## 2023-08-21 DIAGNOSIS — E1165 Type 2 diabetes mellitus with hyperglycemia: Secondary | ICD-10-CM | POA: Diagnosis not present

## 2023-09-20 DIAGNOSIS — E1165 Type 2 diabetes mellitus with hyperglycemia: Secondary | ICD-10-CM | POA: Diagnosis not present

## 2023-09-26 DIAGNOSIS — W57XXXA Bitten or stung by nonvenomous insect and other nonvenomous arthropods, initial encounter: Secondary | ICD-10-CM | POA: Diagnosis not present

## 2023-09-26 DIAGNOSIS — I10 Essential (primary) hypertension: Secondary | ICD-10-CM | POA: Diagnosis not present

## 2023-09-26 DIAGNOSIS — Z79899 Other long term (current) drug therapy: Secondary | ICD-10-CM | POA: Diagnosis not present

## 2023-09-26 DIAGNOSIS — E7800 Pure hypercholesterolemia, unspecified: Secondary | ICD-10-CM | POA: Diagnosis not present

## 2023-09-26 DIAGNOSIS — E119 Type 2 diabetes mellitus without complications: Secondary | ICD-10-CM | POA: Diagnosis not present

## 2023-09-26 DIAGNOSIS — Z87891 Personal history of nicotine dependence: Secondary | ICD-10-CM | POA: Diagnosis not present

## 2023-09-26 DIAGNOSIS — S30861A Insect bite (nonvenomous) of abdominal wall, initial encounter: Secondary | ICD-10-CM | POA: Diagnosis not present

## 2023-10-04 DIAGNOSIS — E7800 Pure hypercholesterolemia, unspecified: Secondary | ICD-10-CM | POA: Diagnosis not present

## 2023-10-04 DIAGNOSIS — Z79899 Other long term (current) drug therapy: Secondary | ICD-10-CM | POA: Diagnosis not present

## 2023-10-04 DIAGNOSIS — I10 Essential (primary) hypertension: Secondary | ICD-10-CM | POA: Diagnosis not present

## 2023-10-04 DIAGNOSIS — R5383 Other fatigue: Secondary | ICD-10-CM | POA: Diagnosis not present

## 2023-10-04 DIAGNOSIS — Z0000 Encounter for general adult medical examination without abnormal findings: Secondary | ICD-10-CM | POA: Diagnosis not present

## 2023-10-04 DIAGNOSIS — R0789 Other chest pain: Secondary | ICD-10-CM | POA: Diagnosis not present

## 2023-10-04 DIAGNOSIS — Z299 Encounter for prophylactic measures, unspecified: Secondary | ICD-10-CM | POA: Diagnosis not present

## 2023-10-04 DIAGNOSIS — R0602 Shortness of breath: Secondary | ICD-10-CM | POA: Diagnosis not present

## 2023-10-04 DIAGNOSIS — Z7189 Other specified counseling: Secondary | ICD-10-CM | POA: Diagnosis not present

## 2023-10-04 DIAGNOSIS — L237 Allergic contact dermatitis due to plants, except food: Secondary | ICD-10-CM | POA: Diagnosis not present

## 2023-10-04 DIAGNOSIS — R918 Other nonspecific abnormal finding of lung field: Secondary | ICD-10-CM | POA: Diagnosis not present

## 2023-10-18 ENCOUNTER — Other Ambulatory Visit: Payer: Self-pay | Admitting: Urology

## 2023-10-21 DIAGNOSIS — E1165 Type 2 diabetes mellitus with hyperglycemia: Secondary | ICD-10-CM | POA: Diagnosis not present

## 2023-11-20 DIAGNOSIS — E1165 Type 2 diabetes mellitus with hyperglycemia: Secondary | ICD-10-CM | POA: Diagnosis not present

## 2023-12-21 DIAGNOSIS — E1165 Type 2 diabetes mellitus with hyperglycemia: Secondary | ICD-10-CM | POA: Diagnosis not present

## 2024-01-04 DIAGNOSIS — Z23 Encounter for immunization: Secondary | ICD-10-CM | POA: Diagnosis not present

## 2024-01-04 DIAGNOSIS — Z299 Encounter for prophylactic measures, unspecified: Secondary | ICD-10-CM | POA: Diagnosis not present

## 2024-01-04 DIAGNOSIS — I509 Heart failure, unspecified: Secondary | ICD-10-CM | POA: Diagnosis not present

## 2024-01-04 DIAGNOSIS — E119 Type 2 diabetes mellitus without complications: Secondary | ICD-10-CM | POA: Diagnosis not present

## 2024-01-04 DIAGNOSIS — I10 Essential (primary) hypertension: Secondary | ICD-10-CM | POA: Diagnosis not present

## 2024-01-05 DIAGNOSIS — R5383 Other fatigue: Secondary | ICD-10-CM | POA: Diagnosis not present

## 2024-01-05 DIAGNOSIS — E7800 Pure hypercholesterolemia, unspecified: Secondary | ICD-10-CM | POA: Diagnosis not present

## 2024-01-05 DIAGNOSIS — Z79899 Other long term (current) drug therapy: Secondary | ICD-10-CM | POA: Diagnosis not present

## 2024-01-10 DIAGNOSIS — H35031 Hypertensive retinopathy, right eye: Secondary | ICD-10-CM | POA: Diagnosis not present
